# Patient Record
Sex: Male | Born: 1975
Health system: Southern US, Community
[De-identification: ages and names within clinical notes are randomized; demographics above are authoritative.]

## PROBLEM LIST (undated history)

## (undated) DIAGNOSIS — E049 Nontoxic goiter, unspecified: Secondary | ICD-10-CM

## (undated) DIAGNOSIS — E039 Hypothyroidism, unspecified: Secondary | ICD-10-CM

## (undated) DIAGNOSIS — E785 Hyperlipidemia, unspecified: Secondary | ICD-10-CM

## (undated) DIAGNOSIS — K219 Gastro-esophageal reflux disease without esophagitis: Secondary | ICD-10-CM

## (undated) HISTORY — DX: Gastro-esophageal reflux disease without esophagitis: K21.9

## (undated) HISTORY — DX: Nontoxic goiter, unspecified: E04.9

## (undated) HISTORY — DX: Hypothyroidism, unspecified: E03.9

## (undated) HISTORY — DX: Hyperlipidemia, unspecified: E78.5

---

## 2010-09-21 ENCOUNTER — Other Ambulatory Visit: Payer: Self-pay | Admitting: Surgery

## 2010-09-21 ENCOUNTER — Encounter (HOSPITAL_COMMUNITY): Payer: Managed Care, Other (non HMO) | Attending: Surgery

## 2010-09-21 DIAGNOSIS — Z01812 Encounter for preprocedural laboratory examination: Secondary | ICD-10-CM | POA: Insufficient documentation

## 2010-09-21 DIAGNOSIS — Z79899 Other long term (current) drug therapy: Secondary | ICD-10-CM | POA: Insufficient documentation

## 2010-09-21 LAB — BASIC METABOLIC PANEL
Chloride: 103 mEq/L (ref 96–112)
GFR calc Af Amer: >60 mL/min (ref >60)
GFR calc non Af Amer: >60 mL/min (ref >60)
Potassium: 4.3 mEq/L (ref 3.5–5.1)

## 2010-09-21 LAB — DIFFERENTIAL
Eosinophils Relative: 7 % — ABNORMAL HIGH (ref 0–5)
Lymphocytes Relative: 25 % (ref 12–46)
Lymphs Abs: 1.5 10*3/uL (ref 0.7–4.0)
Neutro Abs: 3.6 10*3/uL (ref 1.7–7.7)
Neutrophils Relative %: 60 % (ref 43–77)

## 2010-09-21 LAB — SURGICAL PCR SCREEN
MRSA, PCR: NEGATIVE
Staphylococcus aureus: POSITIVE — AB

## 2010-09-21 LAB — CBC
HCT: 45.4 % (ref 39.0–52.0)
Hemoglobin: 15.2 g/dL (ref 13.0–17.0)
MCV: 85.2 fL (ref 78.0–100.0)
RBC: 5.33 MIL/uL (ref 4.22–5.81)
WBC: 6 10*3/uL (ref 4.0–10.5)

## 2010-09-21 LAB — URINALYSIS, ROUTINE W REFLEX MICROSCOPIC
Bilirubin Urine: NEGATIVE
Glucose, UA: NEGATIVE mg/dL
Hgb urine dipstick: NEGATIVE
Specific Gravity, Urine: 1.017 (ref 1.005–1.030)
pH: 6.5 (ref 5.0–8.0)

## 2010-09-21 LAB — PROTIME-INR
INR: 0.88 (ref 0.00–1.49)
Prothrombin Time: 12.1 seconds (ref 11.6–15.2)

## 2010-10-03 ENCOUNTER — Observation Stay (HOSPITAL_COMMUNITY)
Admission: RE | Admit: 2010-10-03 | Discharge: 2010-10-04 | Disposition: A | Discharge status: Home / Self Care | Payer: Managed Care, Other (non HMO) | Source: Ambulatory Visit | Attending: Surgery | Admitting: Surgery

## 2010-10-03 ENCOUNTER — Other Ambulatory Visit: Payer: Self-pay | Admitting: Surgery

## 2010-10-03 DIAGNOSIS — E042 Nontoxic multinodular goiter: Principal | ICD-10-CM | POA: Insufficient documentation

## 2010-10-03 DIAGNOSIS — Z79899 Other long term (current) drug therapy: Secondary | ICD-10-CM | POA: Insufficient documentation

## 2010-10-03 DIAGNOSIS — E039 Hypothyroidism, unspecified: Secondary | ICD-10-CM | POA: Insufficient documentation

## 2010-10-03 DIAGNOSIS — Z01812 Encounter for preprocedural laboratory examination: Secondary | ICD-10-CM | POA: Insufficient documentation

## 2010-10-03 DIAGNOSIS — K219 Gastro-esophageal reflux disease without esophagitis: Secondary | ICD-10-CM | POA: Insufficient documentation

## 2010-10-03 DIAGNOSIS — E063 Autoimmune thyroiditis: Secondary | ICD-10-CM | POA: Insufficient documentation

## 2010-10-03 HISTORY — PX: TOTAL THYROIDECTOMY: SHX2547

## 2010-10-04 LAB — CALCIUM: Calcium: 9.3 mg/dL (ref 8.4–10.5)

## 2010-10-20 NOTE — Op Note (Signed)
NAMECZAR, YSAGUIRRE NO.:  [id number]  MEDICAL RECORD NO.:  [id number]           PATIENT TYPE:  O  LOCATION:  DAYL                         FACILITY:  [facility]  PHYSICIAN:  Velora Heckler, MD      DATE OF BIRTH:  Sep 22, 1975  DATE OF PROCEDURE:  10/03/2010                               OPERATIVE REPORT   PREOPERATIVE DIAGNOSES:  Multinodular thyroid goiter with moderate compressive symptoms, hypothyroidism, Hurthle cell changes.  POSTOPERATIVE DIAGNOSES:  Multinodular thyroid goiter with moderate compressive symptoms, hypothyroidism, Hurthle cell changes.  PROCEDURE:  Total thyroidectomy.  SURGEON:  Velora Heckler, MD, FACS  ANESTHESIA:  General.  ESTIMATED BLOOD LOSS:  150 cc.  PREPARATION:  ChloraPrep.  COMPLICATIONS:  None.  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  He was diagnosed by his primary physician in 2009 with a thyroid goiter.  He was started on thyroid medication. He has had a gradual increase in size of the gland documented by sequential ultrasound examination.  Fine-needle aspiration biopsy identified Hurthle cell changes felt to be related to thyroiditis.  The patient now comes to Surgery for thyroidectomy.  BODY OF REPORT:  Procedure was done in OR #2 at the [facility].  The patient was brought to the operating room, placed in supine position on the operating room table.  Following administration of general anesthesia, the patient was positioned and then prepped and draped in the usual strict aseptic fashion.  After ascertaining that an adequate level of anesthesia had been achieved, a Kocher incision was made with a #15 blade.  Dissection was carried through subcutaneous tissues and platysma.  Hemostasis was obtained via electrocautery.  Skin flaps were elevated cephalad and caudad from the thyroid notch to the sternal notch.  A Mahorner self-retaining  retractor was placed for exposure.  Strap muscles were incised in the midline. Dissection was begun on the left side.  Strap muscles were reflected laterally exposing the left thyroid lobe.  There is some adhesion of the strap muscles to the surface of the gland consistent with inflammatory changes.  Venous tributaries were divided between medium Ligaclips with the harmonic scalpel.  Gland was gently mobilized.  Inferior venous tributaries were divided between medium Ligaclips with the harmonic scalpel.  Superior pole was then dissected out and superior pole vessels were divided individually between medium Ligaclips with the harmonic scalpel.  Gland was mobilized laterally and posteriorly and rolled anteriorly.  Branches of the inferior thyroid artery were divided between small and small Ligaclips using the Harmonic scalpel. Parathyroid tissue was identified and preserved.  Recurrent laryngeal nerve was identified and preserved.  Gland was rolled up and on to the anterior trachea from which it was mobilized with the electrocautery. Isthmus was mobilized across the midline.  Venous tributaries were divided between Ligaclips with the Harmonic scalpel.  There was no significant pyramidal lobe identified.  There was a left node overlying the thyroid cartilage.  This was excised and submitted with the specimen for review.  Dry pack was placed in the left neck.  Next  we turned our attention to the right.  Strap muscles were again reflected laterally.  Right lobe was also somewhat adherent to the overlying strap muscles consistent with inflammatory change.  Venous tributaries were divided between medium Ligaclips with the harmonic scalpel.  Superior pole goes exceedingly high in the right neck and requires careful dissection.  Vascular structures were divided individually between medium and small Ligaclips with the harmonic scalpel.  Gland was rolled anteriorly.  Inferior venous tributaries  were divided between medium Ligaclips with the harmonic scalpel.  Branches of the inferior thyroid artery were divided between small and medium Ligaclips.  Gland was gradually mobilized anteriorly.  Parathyroid tissue was preserved.  Recurrent laryngeal nerve was identified and preserved.  Ligament of Allyson Sabal was released with the electrocautery and the gland was excised off the anterior trachea using the harmonic scalpel for hemostasis.  The entire thyroid gland was resected.  It was marked with a suture at the left superior pole.  It was submitted in its entirety to pathology.  Neck was irrigated with warm saline.  Hemostasis was obtained bilaterally with use of the electrocautery and small and medium Ligaclips.  Surgicel was placed in the operative field.  Strap muscles were reapproximated in the midline with interrupted 3-0 Vicryl sutures. Platysma was closed with interrupted 3-0 Vicryl sutures.  Skin was closed with running 4-0 Monocryl subcuticular suture.  Wound was washed and dried and Benzoin and Steri-Strips were applied.  Sterile dressings were applied.  The patient was awakened from Anesthesia and brought to the recovery room.  The patient tolerated the procedure well.   Velora Heckler, MD, FACS     TMG/MEDQ  D:  10/03/2010  T:  10/03/2010  Job:  [id number]  cc:   Dorisann Frames, M.D. Fax: [phone]  Velora Heckler, MD [phone] [place] [address]  Electronically Signed by Darnell Level MD on 10/20/2010 06:50:28 AM

## 2010-10-20 NOTE — Discharge Summary (Signed)
  Jay Lang, Jay Lang                  ACCOUNT NO.:  [id number]  MEDICAL RECORD NO.:  [id number]           PATIENT TYPE:  O  LOCATION:  1306                         FACILITY:  [facility]  PHYSICIAN:  Velora Heckler, MD      DATE OF BIRTH:  [birth year]  DATE OF ADMISSION:  10/03/2010 DATE OF DISCHARGE:  10/04/2010                              DISCHARGE SUMMARY   REASON FOR ADMISSION:  Thyroid goiter with compressive symptoms.  BRIEF HISTORY:  The patient is a 35 year old white male from [place], [place].  He was diagnosed in 2009 with thyroid goiter.  This has shown slow enlargement on sequential ultrasound examination.  He has been on thyroid hormone suppression, which has not caused any improvement.  He now comes to surgery for thyroidectomy for thyroid goiter with compressive symptoms and H?rthle cell change on fine- needle aspiration biopsy.  HOSPITAL COURSE:  The patient was admitted to the hospital on April 3 and taken directly to the operating room.  He underwent total thyroidectomy without complication.  Postoperative course was stable. Pain was well-controlled.  Serum calcium level on the evening of surgery was 9.0 and on the morning following surgery was 9.3.  The patient tolerated a regular diet and was prepared for discharge.  DISCHARGE PLAN:  The patient was discharged home on October 04, 2010, in good condition, tolerating a regular diet, and ambulating independently.  DISCHARGE MEDICATIONS:  Discharge medications include his normal dose of Synthroid 88 mcg daily and Percocet as needed for pain.  FOLLOWUP:  The patient will return to see me in the office in 2 to 3 weeks for a wound check.  We will check a calcium level prior to that office visit.  We will also make arrangements for followup with Dr. Dorisann Frames for adjustment of his thyroid hormone replacement.  FINAL DIAGNOSIS:  Thyroid goiter with compressive symptoms, final pathologic results pending at the  time of discharge.  CONDITION ON DISCHARGE:  Good.     Velora Heckler, MD     TMG/MEDQ  D:  10/04/2010  T:  10/04/2010  Job:  [id number]  cc:   Dorisann Frames, M.D. Fax: [phone]  Velora Heckler, MD [phone] [address] [place] [address]  Electronically Signed by Darnell Level MD on 10/20/2010 06:49:59 AM

## 2010-11-24 ENCOUNTER — Encounter (INDEPENDENT_AMBULATORY_CARE_PROVIDER_SITE_OTHER): Payer: Self-pay | Admitting: Surgery

## 2011-01-26 ENCOUNTER — Telehealth (INDEPENDENT_AMBULATORY_CARE_PROVIDER_SITE_OTHER): Payer: Self-pay | Admitting: Surgery

## 2011-01-26 NOTE — Telephone Encounter (Signed)
Left message will forward call to Dr. Gerrit Friends for update.

## 2011-03-16 ENCOUNTER — Encounter (INDEPENDENT_AMBULATORY_CARE_PROVIDER_SITE_OTHER): Payer: Self-pay | Admitting: Surgery

## 2011-03-19 ENCOUNTER — Ambulatory Visit (INDEPENDENT_AMBULATORY_CARE_PROVIDER_SITE_OTHER): Payer: Managed Care, Other (non HMO) | Admitting: Surgery

## 2011-03-19 ENCOUNTER — Encounter (INDEPENDENT_AMBULATORY_CARE_PROVIDER_SITE_OTHER): Payer: Self-pay | Admitting: Surgery

## 2011-03-19 VITALS — BP 146/98 | HR 72 | Temp 97.5°F | Ht 75.0 in | Wt 302.6 lb

## 2011-03-19 DIAGNOSIS — Z85850 Personal history of malignant neoplasm of thyroid: Secondary | ICD-10-CM | POA: Insufficient documentation

## 2011-03-19 DIAGNOSIS — C73 Malignant neoplasm of thyroid gland: Secondary | ICD-10-CM

## 2011-03-19 NOTE — Progress Notes (Signed)
Visit Diagnoses: 1. Thyroid cancer, multifocal papillary carcinoma, T1a, N0, Mx     HISTORY: Patient is a 35 year old male who underwent total thyroidectomy in April 2012 for multifocal papillary thyroid carcinoma. He is currently taking Synthroid 225 mcg daily under the direction of his endocrinologist. He and his wife have been trying to conceive their first child. He has delayed radioactive iodine treatment as a consequence. However they have decided to proceed with radioactive iodine treatment sometime this fall lower winter. He will discuss this with his endocrinologist and schedule accordingly.   PERTINENT REVIEW OF SYSTEMS: Patient has noted some mild solid food dysphagia, particularly with tough meats and breads. He has had minimum voice changes, and those have largely resolved.   EXAM: HEENT: normocephalic; pupils equal and reactive; sclerae clear; dentition good; mucous membranes moist NECK:  Surgical wound is well healed with good cosmetic result. Palpation in the thyroid bed shows no nodularity. There is no sign of recurrent disease.  symmetric on extension; no palpable anterior or posterior cervical lymphadenopathy; no supraclavicular masses; no tenderness CHEST: clear to auscultation bilaterally without rales, rhonchi, or wheezes CARDIAC: regular rate and rhythm without significant murmur; peripheral pulses are full EXT:  non-tender without edema; no deformity NEURO: no gross focal deficits; no sign of tremor   IMPRESSION: #1 multifocal papillary thyroid carcinoma, no evidence of recurrent disease #2 radioactive iodine ablation pending a decision between the patient and his endocrinologist   PLAN: The patient and I discussed all the above issues. He and his wife have decided to proceed with radioactive iodine treatment in the coming months. He will coordinate this with his endocrinologist.  Patient will return for a scheduled surgical followup in 6 months.   Velora Heckler, MD, FACS General & Endocrine Surgery [facility]

## 2011-04-19 ENCOUNTER — Telehealth (INDEPENDENT_AMBULATORY_CARE_PROVIDER_SITE_OTHER): Payer: Self-pay

## 2011-04-19 NOTE — Telephone Encounter (Signed)
Called to inform patient that his voice message was received today.  Advised patient to have notes faxed to [phone]. He stated he will have a procedure due to swallowing steak (or unable to) last night. Checked by notes are not in the [facility] system at this time. RMP

## 2011-05-22 ENCOUNTER — Other Ambulatory Visit (HOSPITAL_COMMUNITY): Payer: Self-pay | Admitting: Endocrinology

## 2011-05-22 DIAGNOSIS — C73 Malignant neoplasm of thyroid gland: Secondary | ICD-10-CM

## 2011-06-12 ENCOUNTER — Encounter (HOSPITAL_COMMUNITY)
Admission: RE | Admit: 2011-06-12 | Discharge: 2011-06-12 | Disposition: A | Discharge status: Home / Self Care | Payer: Managed Care, Other (non HMO) | Source: Ambulatory Visit | Attending: Endocrinology | Admitting: Endocrinology

## 2011-06-12 DIAGNOSIS — C73 Malignant neoplasm of thyroid gland: Secondary | ICD-10-CM | POA: Insufficient documentation

## 2011-06-12 MED ORDER — THYROTROPIN ALFA 1.1 MG IM SOLR
0.9000 mg | INTRAMUSCULAR | Status: DC
  Filled 2011-06-12: qty 0.9

## 2011-06-13 ENCOUNTER — Ambulatory Visit (HOSPITAL_COMMUNITY)
Admission: RE | Admit: 2011-06-13 | Discharge: 2011-06-13 | Disposition: A | Discharge status: Home / Self Care | Payer: Managed Care, Other (non HMO) | Source: Ambulatory Visit | Attending: Endocrinology | Admitting: Endocrinology

## 2011-06-13 MED ORDER — THYROTROPIN ALFA 1.1 MG IM SOLR
0.9000 mg | INTRAMUSCULAR | Status: DC
  Filled 2011-06-13: qty 0.9

## 2011-06-14 ENCOUNTER — Ambulatory Visit (HOSPITAL_COMMUNITY)
Admission: RE | Admit: 2011-06-14 | Discharge: 2011-06-14 | Disposition: A | Discharge status: Home / Self Care | Payer: Managed Care, Other (non HMO) | Source: Ambulatory Visit | Attending: Endocrinology | Admitting: Endocrinology

## 2011-06-14 DIAGNOSIS — C73 Malignant neoplasm of thyroid gland: Secondary | ICD-10-CM | POA: Insufficient documentation

## 2011-06-14 MED ORDER — SODIUM IODIDE I 131 CAPSULE
69.1000 | Freq: Once | INTRAVENOUS | Status: AC | PRN
  Administered 2011-06-14: 69.1 via ORAL

## 2011-06-21 ENCOUNTER — Encounter (HOSPITAL_COMMUNITY)
Admission: RE | Admit: 2011-06-21 | Discharge: 2011-06-21 | Disposition: A | Discharge status: Home / Self Care | Payer: Managed Care, Other (non HMO) | Source: Ambulatory Visit | Attending: Endocrinology | Admitting: Endocrinology

## 2011-06-21 DIAGNOSIS — C73 Malignant neoplasm of thyroid gland: Secondary | ICD-10-CM | POA: Insufficient documentation

## 2011-09-06 ENCOUNTER — Encounter (INDEPENDENT_AMBULATORY_CARE_PROVIDER_SITE_OTHER): Payer: Self-pay | Admitting: Surgery

## 2011-09-10 ENCOUNTER — Encounter (INDEPENDENT_AMBULATORY_CARE_PROVIDER_SITE_OTHER): Payer: Managed Care, Other (non HMO) | Admitting: Surgery

## 2011-10-08 ENCOUNTER — Encounter (INDEPENDENT_AMBULATORY_CARE_PROVIDER_SITE_OTHER): Payer: Managed Care, Other (non HMO) | Admitting: Surgery

## 2011-12-06 ENCOUNTER — Ambulatory Visit (INDEPENDENT_AMBULATORY_CARE_PROVIDER_SITE_OTHER): Payer: Managed Care, Other (non HMO) | Admitting: Surgery

## 2011-12-06 ENCOUNTER — Encounter (INDEPENDENT_AMBULATORY_CARE_PROVIDER_SITE_OTHER): Payer: Self-pay | Admitting: Surgery

## 2011-12-06 VITALS — BP 118/78 | HR 72 | Temp 97.8°F | Ht 75.0 in | Wt 308.0 lb

## 2011-12-06 DIAGNOSIS — C73 Malignant neoplasm of thyroid gland: Secondary | ICD-10-CM

## 2011-12-06 NOTE — Progress Notes (Signed)
Visit Diagnoses: 1. Thyroid cancer, multifocal papillary carcinoma, T1a, N0, Mx     HISTORY: The patient is a 36 year old white male who underwent total thyroidectomy in April 2012 for multifocal papillary thyroid carcinoma. The patient received radioactive iodine treatment in December 2012. He is now taking Synthroid 350 mcg daily. Patient returns for scheduled followup.  PERTINENT REVIEW OF SYSTEMS: Intermittent dysphagia related to reflux symptoms, denies tremor, denies palpitations, denies dyspnea, moderate weight gain due to 2 persistent hypothyroidism  EXAM: HEENT: normocephalic; pupils equal and reactive; sclerae clear; dentition good; mucous membranes moist NECK:  Well-healed cervical incision with good cosmetic result, no palpable masses in thyroid bed; symmetric on extension; no palpable anterior or posterior cervical lymphadenopathy; no supraclavicular masses; no tenderness CHEST: clear to auscultation bilaterally without rales, rhonchi, or wheezes CARDIAC: regular rate and rhythm without significant murmur; peripheral pulses are full EXT:  non-tender without edema; no deformity NEURO: no gross focal deficits; no sign of tremor   IMPRESSION: Personal history of multifocal papillary thyroid carcinoma, no evidence of recurrent disease  PLAN: Patient will continue close follow up with his endocrinologist. She continues to adjust his thyroid hormone replacement in hopes of suppressing his TSH level. Patient will have a followup total body iodine scan in December 2013. Patient will return to see me for followup in one year.  Velora Heckler, MD, FACS General & Endocrine Surgery [facility]

## 2012-05-09 MED FILL — Thyrotropin Alfa For Inj 1.1 MG: INTRAMUSCULAR | Qty: 0.9 | Status: AC

## 2012-06-03 ENCOUNTER — Other Ambulatory Visit: Payer: Self-pay | Admitting: Endocrinology

## 2012-06-03 DIAGNOSIS — C73 Malignant neoplasm of thyroid gland: Secondary | ICD-10-CM

## 2012-07-21 ENCOUNTER — Encounter (HOSPITAL_COMMUNITY)
Admission: RE | Admit: 2012-07-21 | Discharge: 2012-07-21 | Disposition: A | Discharge status: Home / Self Care | Payer: BC Managed Care – PPO | Source: Ambulatory Visit | Attending: Endocrinology | Admitting: Endocrinology

## 2012-07-21 DIAGNOSIS — C73 Malignant neoplasm of thyroid gland: Secondary | ICD-10-CM

## 2012-07-21 MED ORDER — THYROTROPIN ALFA 1.1 MG IM SOLR
0.9000 mg | INTRAMUSCULAR | Status: AC
  Administered 2012-07-21: 0.9 mg via INTRAMUSCULAR
  Filled 2012-07-21: qty 0.9

## 2012-07-22 ENCOUNTER — Encounter (HOSPITAL_COMMUNITY)
Admission: RE | Admit: 2012-07-22 | Discharge: 2012-07-22 | Disposition: A | Discharge status: Home / Self Care | Payer: BC Managed Care – PPO | Source: Ambulatory Visit | Attending: Endocrinology | Admitting: Endocrinology

## 2012-07-22 MED ORDER — THYROTROPIN ALFA 1.1 MG IM SOLR
0.9000 mg | INTRAMUSCULAR | Status: AC
  Administered 2012-07-22: 0.9 mg via INTRAMUSCULAR
  Filled 2012-07-22: qty 0.9

## 2012-07-23 ENCOUNTER — Encounter (HOSPITAL_COMMUNITY)
Admission: RE | Admit: 2012-07-23 | Discharge: 2012-07-23 | Disposition: A | Discharge status: Home / Self Care | Payer: BC Managed Care – PPO | Source: Ambulatory Visit | Attending: Endocrinology | Admitting: Endocrinology

## 2012-07-25 ENCOUNTER — Encounter (HOSPITAL_COMMUNITY)
Admission: RE | Admit: 2012-07-25 | Discharge: 2012-07-25 | Disposition: A | Discharge status: Home / Self Care | Payer: BC Managed Care – PPO | Source: Ambulatory Visit | Attending: Endocrinology | Admitting: Endocrinology

## 2012-07-25 MED ORDER — SODIUM IODIDE I 131 CAPSULE
4.0000 | Freq: Once | INTRAVENOUS | Status: AC | PRN
  Administered 2012-07-25: 4 via ORAL

## 2014-04-02 ENCOUNTER — Telehealth: Payer: Self-pay | Admitting: Family Medicine

## 2014-04-02 NOTE — Telephone Encounter (Signed)
Patient wants to schedule with Dr. Laurance Flatten he stated that he saw him as a little boy and that Pia Mau was his grandfather. Please advise

## 2014-04-05 NOTE — Telephone Encounter (Signed)
Checking to see if he is able to see [facility].  Call Lennette Bihari at [phone]

## 2014-04-08 NOTE — Telephone Encounter (Signed)
Pt aware - [facility] is not taking on New pt's at this time He was set up with Dr Sabra Heck for 05-25-14.

## 2014-05-25 ENCOUNTER — Ambulatory Visit: Payer: Self-pay | Admitting: Family Medicine

## 2014-06-17 ENCOUNTER — Ambulatory Visit (INDEPENDENT_AMBULATORY_CARE_PROVIDER_SITE_OTHER): Payer: BC Managed Care – PPO | Admitting: Family Medicine

## 2014-06-17 ENCOUNTER — Encounter: Payer: Self-pay | Admitting: Family Medicine

## 2014-06-17 ENCOUNTER — Ambulatory Visit (INDEPENDENT_AMBULATORY_CARE_PROVIDER_SITE_OTHER): Payer: BC Managed Care – PPO

## 2014-06-17 VITALS — BP 119/73 | HR 72 | Temp 98.1°F | Ht 75.0 in | Wt 329.0 lb

## 2014-06-17 DIAGNOSIS — K2100 Gastro-esophageal reflux disease with esophagitis, without bleeding: Secondary | ICD-10-CM

## 2014-06-17 DIAGNOSIS — Z0000 Encounter for general adult medical examination without abnormal findings: Secondary | ICD-10-CM

## 2014-06-17 DIAGNOSIS — C73 Malignant neoplasm of thyroid gland: Secondary | ICD-10-CM

## 2014-06-17 DIAGNOSIS — E890 Postprocedural hypothyroidism: Secondary | ICD-10-CM

## 2014-06-17 DIAGNOSIS — E785 Hyperlipidemia, unspecified: Secondary | ICD-10-CM

## 2014-06-17 NOTE — Patient Instructions (Addendum)
Continue current medications. Continue good therapeutic lifestyle changes which include good diet and exercise. Fall precautions discussed with patient. If an FOBT was given today- please return it to our front desk.   Flu Shots will be available at our office starting mid- September. Please call and schedule a FLU CLINIC APPOINTMENT.   We will arrange for you to see the clinical pharmacist to help you with your diet and weight loss regimen Continue to take her thyroid medicine, continue your medication for reflux Continue to drink plenty of fluids Avoid all soda drinks Walk and exercise as much as possible Return an FOBT We will call you with the results of your lab work and chest x-ray results as soon as those results become available Please let my nurse know the name of the medication or cream that you use on your feet and we will refill this. Use moisturizing lotions as much as possible

## 2014-06-17 NOTE — Progress Notes (Signed)
Subjective:    Patient ID: Jay Lang, male    DOB: [birth year], 38 y.o.   MRN: [id number]  HPI Patient here today to establish care with our practice and have some lab levels drawn. The patient's biggest problem is his history of thyroid cancer for which he is now currently taking thyroid replacement. He explained to return to get fasting lab work. We will also give him an FOBT to return and give him a flu shot today. We will get a chest x-ray as a baseline. When the  lab work is back we will probably set him up for a visit with the clinical pharmacist to work with him on nutrition and weight loss in combination with exercise. The patient's father died of a cerebral hemorrhage and the patient's mother is living and has had breast cancer but is doing well.        Patient Active Problem List   Diagnosis Date Noted  . Thyroid cancer, multifocal papillary carcinoma, T1a, N0, Mx 03/19/2011   Outpatient Encounter Prescriptions as of 06/17/2014  Medication Sig  . levothyroxine (SYNTHROID, LEVOTHROID) 125 MCG tablet Take 250 mcg by mouth daily.  Marland Kitchen NEXIUM 40 MG capsule   . [DISCONTINUED] levothyroxine (SYNTHROID, LEVOTHROID) 175 MCG tablet Take 350 mcg by mouth daily.    Review of Systems  Constitutional: Negative.        Weight issues   HENT: Negative.   Eyes: Negative.   Respiratory: Negative.   Cardiovascular: Negative.   Gastrointestinal: Negative.   Endocrine: Negative.   Genitourinary: Negative.   Musculoskeletal: Negative.   Skin: Negative.   Allergic/Immunologic: Negative.   Neurological: Negative.   Hematological: Negative.   Psychiatric/Behavioral: Negative.        Objective:   Physical Exam  Constitutional: He is oriented to person, place, and time. He appears well-developed and well-nourished. No distress.  HENT:  Head: Normocephalic and atraumatic.  Right Ear: External ear normal.  Left Ear: External ear normal.  Nose: Nose normal.  Mouth/Throat: Oropharynx is  clear and moist. No oropharyngeal exudate.  Eyes: Conjunctivae and EOM are normal. Pupils are equal, round, and reactive to light. Right eye exhibits no discharge. Left eye exhibits no discharge. No scleral icterus.  Neck: Normal range of motion. Neck supple. No thyromegaly present.  No masses adenopathy or thyromegaly  Cardiovascular: Normal rate, regular rhythm, normal heart sounds and intact distal pulses.  Exam reveals no gallop and no friction rub.   No murmur heard. At 72/m  Pulmonary/Chest: Effort normal and breath sounds normal. No respiratory distress. He has no wheezes. He has no rales. He exhibits no tenderness.  Abdominal: Soft. Bowel sounds are normal. He exhibits no mass. There is no tenderness. There is no rebound and no guarding.  Morbidly obese without masses or tenderness  Musculoskeletal: Normal range of motion. He exhibits no edema or tenderness.  Lymphadenopathy:    He has no cervical adenopathy.  Neurological: He is alert and oriented to person, place, and time. He has normal reflexes. No cranial nerve deficit.  Skin: Skin is warm and dry. No rash noted. No erythema. No pallor.  The skin on both feet is very dry on the plantar surface.  Psychiatric: He has a normal mood and affect. His behavior is normal. Judgment and thought content normal.  Nursing note and vitals reviewed.  BP 119/73 mmHg  Pulse 72  Temp(Src) 98.1 F (36.7 C) (Oral)  Ht 6\' 3"  (1.905 m)  Wt 329 lb (149.233 kg)  BMI 41.12 kg/m2  [facility] reading (PRIMARY) by  Dr.Moore-chest x-ray--  no active disease                                      Assessment & Plan:  1. Obesity, Class III, BMI 40-49.9 (morbid obesity)   2. Gastroesophageal reflux disease with esophagitis  3. Hyperlipidemia  4. Thyroid cancer  5. Postoperative hypothyroidism  6. Health care maintenance  Patient Instructions  Continue current medications. Continue good therapeutic lifestyle changes which include good diet and  exercise. Fall precautions discussed with patient. If an FOBT was given today- please return it to our front desk.   Flu Shots will be available at our office starting mid- September. Please call and schedule a FLU CLINIC APPOINTMENT.   We will arrange for you to see the clinical pharmacist to help you with your diet and weight loss regimen Continue to take her thyroid medicine, continue your medication for reflux Continue to drink plenty of fluids Avoid all soda drinks Walk and exercise as much as possible Return an FOBT We will call you with the results of your lab work and chest x-ray results as soon as those results become available Please let my nurse know the name of the medication or cream that you use on your feet and we will refill this. Use moisturizing lotions as much as possible    Arrie Senate MD

## 2014-06-30 ENCOUNTER — Ambulatory Visit (INDEPENDENT_AMBULATORY_CARE_PROVIDER_SITE_OTHER): Payer: BC Managed Care – PPO | Admitting: *Deleted

## 2014-06-30 ENCOUNTER — Other Ambulatory Visit (INDEPENDENT_AMBULATORY_CARE_PROVIDER_SITE_OTHER): Payer: BC Managed Care – PPO

## 2014-06-30 DIAGNOSIS — K2100 Gastro-esophageal reflux disease with esophagitis, without bleeding: Secondary | ICD-10-CM

## 2014-06-30 DIAGNOSIS — R635 Abnormal weight gain: Secondary | ICD-10-CM

## 2014-06-30 DIAGNOSIS — E785 Hyperlipidemia, unspecified: Secondary | ICD-10-CM

## 2014-06-30 DIAGNOSIS — E890 Postprocedural hypothyroidism: Secondary | ICD-10-CM

## 2014-06-30 DIAGNOSIS — Z23 Encounter for immunization: Secondary | ICD-10-CM

## 2014-06-30 DIAGNOSIS — Z1212 Encounter for screening for malignant neoplasm of rectum: Secondary | ICD-10-CM

## 2014-06-30 DIAGNOSIS — C73 Malignant neoplasm of thyroid gland: Secondary | ICD-10-CM

## 2014-06-30 DIAGNOSIS — Z0000 Encounter for general adult medical examination without abnormal findings: Secondary | ICD-10-CM

## 2014-06-30 LAB — POCT CBC
Granulocyte percent: 70.9 %G (ref 37–80)
HCT, POC: 48.3 % (ref 43.5–53.7)
Hemoglobin: 15.2 g/dL (ref 14.1–18.1)
LYMPH, POC: 1.7 (ref 0.6–3.4)
MCH, POC: 26.3 pg — AB (ref 27–31.2)
MCHC: 31.5 g/dL — AB (ref 31.8–35.4)
MCV: 83.6 fL (ref 80–97)
MPV: 7.7 fL (ref 0–99.8)
PLATELET COUNT, POC: 303 10*3/uL (ref 142–424)
POC GRANULOCYTE: 5.2 (ref 2–6.9)
POC LYMPH PERCENT: 23.8 %L (ref 10–50)
RBC: 5.8 M/uL (ref 4.69–6.13)
RDW, POC: 13 %
WBC: 7.3 10*3/uL (ref 4.6–10.2)

## 2014-06-30 NOTE — Progress Notes (Signed)
Lab only 

## 2014-06-30 NOTE — Addendum Note (Signed)
Addended by: Earlene Plater on: 06/30/2014 11:42 AM   Modules accepted: Orders

## 2014-07-01 ENCOUNTER — Telehealth: Payer: Self-pay | Admitting: *Deleted

## 2014-07-01 LAB — BMP8+EGFR
BUN/Creatinine Ratio: 15 (ref 8–19)
BUN: 12 mg/dL (ref 6–20)
CO2: 25 mmol/L (ref 18–29)
CREATININE: 0.79 mg/dL (ref 0.76–1.27)
Calcium: 9.3 mg/dL (ref 8.7–10.2)
Chloride: 99 mmol/L (ref 97–108)
GFR calc Af Amer: 134 mL/min/{1.73_m2} (ref >59)
GFR calc non Af Amer: 116 mL/min/{1.73_m2} (ref >59)
Glucose: 80 mg/dL (ref 65–99)
Potassium: 4.5 mmol/L (ref 3.5–5.2)
Sodium: 139 mmol/L (ref 134–144)

## 2014-07-01 LAB — NMR, LIPOPROFILE
Cholesterol: 229 mg/dL — ABNORMAL HIGH (ref 100–199)
HDL CHOLESTEROL BY NMR: 32 mg/dL — AB (ref >39)
HDL Particle Number: 26.2 umol/L — ABNORMAL LOW (ref >=30.5)
LDL Particle Number: 1988 nmol/L — ABNORMAL HIGH (ref <1000)
LDL SIZE: 19.9 nm (ref >20.5)
LDL-C: 153 mg/dL — ABNORMAL HIGH (ref 0–99)
LP-IR SCORE: 70 — AB (ref <=45)
Small LDL Particle Number: 1483 nmol/L — ABNORMAL HIGH (ref <=527)
Triglycerides by NMR: 222 mg/dL — ABNORMAL HIGH (ref 0–149)

## 2014-07-01 LAB — HEPATIC FUNCTION PANEL
ALBUMIN: 4.4 g/dL (ref 3.5–5.5)
ALT: 73 IU/L — ABNORMAL HIGH (ref 0–44)
AST: 39 IU/L (ref 0–40)
Alkaline Phosphatase: 88 IU/L (ref 39–117)
Bilirubin, Direct: 0.1 mg/dL (ref 0.00–0.40)
Total Bilirubin: 0.4 mg/dL (ref 0.0–1.2)
Total Protein: 7.1 g/dL (ref 6.0–8.5)

## 2014-07-01 NOTE — Telephone Encounter (Signed)
lmtcb regarding lab results. 

## 2014-07-01 NOTE — Telephone Encounter (Signed)
-----   Message from Chipper Herb, MD sent at 07/01/2014  9:43 AM EST ----- The blood sugar is good at 80. The creatinine, the most important kidney function test is within normal limits. The electrolytes including potassium are within normal limits. 1 liver function test is elevated and the patient should have his liver function tests rechecked in 4-6 weeks, he does not have to be fasting. With advanced lipid testing, the total LDL particle number is very elevated at 1988. The LDL C is also elevated at 153. The triglycerides are elevated at 222.------- please schedule this patient for an appointment with the clinical pharmacist to discuss diet modification and exercise and possible treatment for the elevated cholesterol++++++++ in the meantime encourage him to avoid fried greasy foods and to exercise more and work on losing some weight.

## 2014-07-02 LAB — FECAL OCCULT BLOOD, IMMUNOCHEMICAL: FECAL OCCULT BLD: NEGATIVE

## 2014-07-30 ENCOUNTER — Encounter (INDEPENDENT_AMBULATORY_CARE_PROVIDER_SITE_OTHER): Payer: Self-pay

## 2014-07-30 ENCOUNTER — Ambulatory Visit (INDEPENDENT_AMBULATORY_CARE_PROVIDER_SITE_OTHER): Payer: BLUE CROSS/BLUE SHIELD | Admitting: Pharmacist

## 2014-07-30 ENCOUNTER — Encounter: Payer: Self-pay | Admitting: Pharmacist

## 2014-07-30 DIAGNOSIS — E785 Hyperlipidemia, unspecified: Secondary | ICD-10-CM

## 2014-07-30 NOTE — Patient Instructions (Signed)
http://thomas.info/ - app used to track calories.   Mapmyrun.com - way to track calories burned.  Goal of at least 150 minutes of exercise per week

## 2014-07-30 NOTE — Progress Notes (Signed)
Subjective:     Jay Lang is a 39 y.o. male here for discussion regarding weight loss/obesity and dyslipidemia.  Patient is very motivated to make lifestyle changes to help with weight loss and to be healthier.   He has noted a weight gain of approximately 110 pounds over the last 25 years. He feels ideal weight is 265 pounds. Weight at graduation from high school was 218 pounds. History of eating disorders: none. There is a family history positive for obesity in the patient. Previous treatments for obesity include none. Obesity associated medical conditions: hyperlipidemia, thyroid disease and GERD. Obesity associated medications: none. Cardiovascular risk factors besides obesity: dyslipidemia, male gender, obesity (BMI >= 30 kg/m2) and sedentary lifestyle.  The following portions of the patient's history were reviewed and updated as appropriate: allergies, current medications, past family history, past medical history, past social history, past surgical history and problem list.    Objective:    Body mass index is 41.25 kg/(m^2).  Filed Vitals:   07/30/14 0822  BP: 120/72  Pulse: 88   Filed Weights   07/30/14 0822  Weight: 330 lb (149.687 kg)   Lipids Panel (06/30/2014) LDL-P = 1988 LDL-C = 153 HDL-C = 32 Tg = 229 Assessment:    Obesity. I assessed Leon to be in an action stage with respect to weight loss.   Dyslipidemia - patient has elevated LDL and Tg and low HDL.  His AHA ASCVD risk estimate is less than 5% (had to use age of 55 instead of 39yo since youngest estimate is 37yo)   Plan:    General weight loss/lifestyle modification strategies discussed (elicit support from others; identify saboteurs; non-food rewards, etc). Behavioral treatment: stress management and motivational tips discussed. Diet interventions: moderate (500 kCal/d) deficit diet and discussed specific changes such as:  Increase non-starchy vegetables - carrots, green bean, squash, zucchini, tomatoes,  onions, peppers, spinach and other green leafy vegetables, cabbage, lettuce, cucumbers, asparagus, okra (not fried), eggplant  Limit sugar and processed foods (cakes, cookies, ice cream, crackers and chips)  Increase fresh fruit but limit serving sizes 1/2 cup or about the size of tennis or baseball  Limit red meat to no more than 1-2 times per week (serving size about the size of your palm)  Choose whole grains / lean proteins - whole wheat bread, quinoa, whole grain rice (1/2 cup), fish, chicken, [place]  Regular aerobic exercise program discussed - specifically discussed tips to  Will not start statin now - but explained current and future risk of ASCVD to patient and how with TLC can lower risk.  If unable to get lipids down may need to consider statin in future.  Follow up in: 6 weeks and as needed.     Cherre Robins, PharmD, CPP

## 2014-09-17 ENCOUNTER — Encounter: Payer: Self-pay | Admitting: Pharmacist

## 2014-09-17 ENCOUNTER — Encounter (INDEPENDENT_AMBULATORY_CARE_PROVIDER_SITE_OTHER): Payer: Self-pay

## 2014-09-17 ENCOUNTER — Ambulatory Visit (INDEPENDENT_AMBULATORY_CARE_PROVIDER_SITE_OTHER): Payer: BLUE CROSS/BLUE SHIELD | Admitting: Pharmacist

## 2014-09-17 VITALS — Ht 75.0 in | Wt 332.0 lb

## 2014-09-17 DIAGNOSIS — E8881 Metabolic syndrome: Secondary | ICD-10-CM | POA: Insufficient documentation

## 2014-09-17 NOTE — Progress Notes (Signed)
Subjective:     Jay Lang is a 39 y.o. male here for follow up weight loss/obesity and dyslipidemia.  Patient seemed motivated at our last visit to make lifestyle changes to help with weight loss and to be healthier.  However, today he reports that he has not made many of the changes discussed at our last visit.  He does report that over the last 1-2 weeks he joined a basketball league once a week and has purchased exercise equipment and has been using over the last week. He went on vacation recently has he states that he just "blew it" and also that at work new snack machines have been added which have been very tempting.    He has notes a weight gain of approximately 110 pounds over the last 25 years. He feels ideal weight is 265 pounds. Weight at graduation from high school was 218 pounds. History of eating disorders: none. There is a family history positive for obesity in the patient. Previous treatments for obesity include none. Obesity associated medical conditions: hyperlipidemia, thyroid disease and GERD. Obesity associated medications: none. Cardiovascular risk factors besides obesity: dyslipidemia, male gender, obesity (BMI >= 30 kg/m2) and sedentary lifestyle.  The following portions of the patient's history were reviewed and updated as appropriate: allergies, current medications, past family history, past medical history, past social history, past surgical history and problem list.    Objective:    Body mass index is 41.5 kg/(m^2).  January 2016 was 41.25.  There were no vitals filed for this visit. Filed Weights   09/17/14 0841  Weight: 332 lb (150.594 kg)  Weight has increased by 2# since last visit in January 2016.   Lipids Panel (06/30/2014) LDL-P = 1988 LDL-C = 153 HDL-C = 32 Tg = 229 Assessment:    Obesity. I assessed Jay Lang to be in a relapse stage with respect to weight loss.   Metabolic Syndrome    Plan:    General weight loss/lifestyle modification strategies  Reviewed Patient was given hand out with tips for healthier eating.  Diet interventions: moderate (500 kCal/d) deficit diet and discussed specific changes such as:  Increase non-starchy vegetables - carrots, green bean, squash, zucchini, tomatoes, onions, peppers, spinach and other green leafy vegetables, cabbage, lettuce, cucumbers, asparagus, okra (not fried), eggplant  Limit sugar and processed foods (cakes, cookies, ice cream, crackers and chips)  Increase fresh fruit but limit serving sizes 1/2 cup or about the size of tennis or baseball  Limit red meat to no more than 1-2 times per week (serving size about the size of your palm)  Choose whole grains / lean proteins - whole wheat bread, quinoa, whole grain rice (1/2 cup), fish, chicken, [place]  Regular aerobic exercise program discussed - patient has recently made some improvements to more physical activity.  Commended him on these changes.  Encouraged him to stick with daily exercise at home - goal is 150 minutes at least per week.   Follow up in: 4 weeks and as needed.     Jay Lang, PharmD, CPP, CDE

## 2014-10-21 ENCOUNTER — Telehealth: Payer: Self-pay | Admitting: Pharmacist

## 2014-10-21 NOTE — Telephone Encounter (Signed)
There is avaialability on Monday, April 25th at 3:40pm or not until Friday May 13th - can put in slot for 3:30 or 4pm.  Tried to call patient but no answer - left message about above.  If he wants either of these appts he can call office and schedule.

## 2014-10-22 ENCOUNTER — Ambulatory Visit: Payer: Self-pay

## 2014-10-22 NOTE — Telephone Encounter (Signed)
Tried to call patient again about rescheduling appt.  Left message on VM to call me at office and I will do what I can to reschedule missed appt

## 2014-12-20 ENCOUNTER — Ambulatory Visit: Payer: BC Managed Care – PPO | Admitting: Family Medicine

## 2014-12-27 ENCOUNTER — Encounter: Payer: Self-pay | Admitting: Family Medicine

## 2015-01-06 ENCOUNTER — Encounter (INDEPENDENT_AMBULATORY_CARE_PROVIDER_SITE_OTHER): Payer: Self-pay

## 2015-01-06 ENCOUNTER — Encounter: Payer: Self-pay | Admitting: Pharmacist

## 2015-01-06 ENCOUNTER — Ambulatory Visit (INDEPENDENT_AMBULATORY_CARE_PROVIDER_SITE_OTHER): Payer: BLUE CROSS/BLUE SHIELD | Admitting: Pharmacist

## 2015-01-06 DIAGNOSIS — E782 Mixed hyperlipidemia: Secondary | ICD-10-CM | POA: Diagnosis not present

## 2015-01-06 DIAGNOSIS — E8881 Metabolic syndrome: Secondary | ICD-10-CM | POA: Diagnosis not present

## 2015-01-06 NOTE — Progress Notes (Signed)
Subjective:     Jay Lang is a 39 y.o. male here for follow up weight loss/obesity and dyslipidemia.  Patient has been trying to make lifestyle changes to help with weight loss and to be healthier.  However, today he reports that he continues to struggle with increasing physical activity.  The basketball league he joined has stopped for the next 2-3 months.  Patient works in [place] and is gone from home 7am to 7pm.  He has a 78 yo daughter and is married.  He reports going on vacation recently and that he did not adhere to healthy eating recommendations.  Diet - no breakfast, am snack of crackers (low fat) from vending machine, Lunch is [place] burger or tomaot sandwich from home.  Supper - has improved because wife is at home during summer to [place] more and getting vegetables from garden.  He continues to drink 1 Mt Dew (20 oz) daily and sweet tea.  He also drinks water.    He has notes a weight gain of approximately 110 pounds over the last 25 years. He feels ideal weight is 265 pounds. Weight at graduation from high school was 218 pounds. History of eating disorders: none. There is a family history positive for obesity in the patient. Previous treatments for obesity include none. Obesity associated medical conditions: hyperlipidemia, thyroid disease and GERD. Obesity associated medications: none. Cardiovascular risk factors besides obesity: dyslipidemia, male gender, obesity (BMI >= 30 kg/m2) and sedentary lifestyle.  The following portions of the patient's history were reviewed and updated as appropriate: allergies, current medications, past family history, past medical history, past social history, past surgical history and problem list.    Objective:    Body mass index is 41.12 kg/(m^2).   January 2016 was 41.25.  Filed Vitals:   01/06/15 0901  BP: 126/82  Pulse: 88   Filed Weights   01/06/15 0901  Weight: 329 lb (149.233 kg)  Weight has decreased  by 3# since last visit March  2016  Lipids Panel (06/30/2014) LDL-P = 1988 LDL-C = 153 HDL-C = 32 Tg = 229 Assessment:    Obesity. I assessed Jay Lang to be in a relapse stage with respect to weight loss.   Metabolic Syndrome Mixed hyperlipidemia    Plan:    General weight loss/lifestyle modification strategies Reviewed Diet interventions: moderate (500 kCal/d) deficit diet and discussed specific changes such as:  Increase non-starchy vegetables - carrots, green bean, squash, zucchini, tomatoes, onions, peppers, spinach and other green leafy vegetables, cabbage, lettuce, cucumbers, asparagus, okra (not fried), eggplant  Limit sugar and processed foods (cakes, cookies, ice cream, crackers and chips).  Discontinue calorie filled / sugary drinks - try half sweet and half unsweet tea graduallly decreasing amount of sweet tea until he gets use to unsweetened tea.    Increase fresh fruit but limit serving sizes 1/2 cup or about the size of tennis or baseball  Limit red meat to no more than 1-2 times per week (serving size about the size of your palm)  Choose whole grains / lean proteins - whole wheat bread, quinoa, whole grain rice (1/2 cup), fish, chicken, [place]  Discussed health breakfast ideas - boiled egg, oatmeal, high fiber cereal.  Regular aerobic exercise program discussed  Encouraged him to stick with daily exercise at home - goal is 150 minutes at least per week.  Ok to try [employer] - an essential supplement his wife uses which might decrease appetite.   Also discussed Rx medications for  weight loss - patient given information on Contrave but does not want to start rx currently. Discussed health consequences of obesity - GERD, arthritis, diabetes risk.   Orders Placed This Encounter  Procedures  . CMP14+EGFR  . Lipid panel  . LDL cholesterol, direct     Follow up in: 2 weeks with Dr Laurance Flatten and 6 weeks with clinical pharmacist.    Cherre Robins, PharmD, CPP, CDE

## 2015-01-06 NOTE — Patient Instructions (Signed)
Ok to try [place] essential oils  Recommend stop or limit calorie containing beverages - mix sweet and unsweet tea, no Egbert Garibaldi / [place] Dew  Try to get better breakfast - boiled egg, egg white omelet, lean ham + egg on whole wheat toast or sandwich rounds, oatmeal, whole grain cereal - cereal with fiber.   Increase non-starchy vegetables - carrots, green bean, squash, zucchini, tomatoes, onions, peppers, spinach and other green leafy vegetables, cabbage, lettuce, cucumbers, asparagus, okra (not fried), eggplant limit sugar and processed foods (cakes, cookies, ice cream, crackers and chips) Increase fresh fruit but limit serving sizes 1/2 cup or about the size of tennis or baseball limit red meat to no more than 1-2 times per week (serving size about the size of your palm) Choose lean proteins - whole wheat bread, quinoa, whole grain rice (1/2 cup), fish, chicken, [place]

## 2015-01-07 LAB — LIPID PANEL
CHOLESTEROL TOTAL: 210 mg/dL — AB (ref 100–199)
Chol/HDL Ratio: 6.2 ratio units — ABNORMAL HIGH (ref 0.0–5.0)
HDL: 34 mg/dL — ABNORMAL LOW (ref >39)
LDL Calculated: 140 mg/dL — ABNORMAL HIGH (ref 0–99)
Triglycerides: 182 mg/dL — ABNORMAL HIGH (ref 0–149)
VLDL Cholesterol Cal: 36 mg/dL (ref 5–40)

## 2015-01-07 LAB — CMP14+EGFR
A/G RATIO: 1.8 (ref 1.1–2.5)
ALT: 73 IU/L — ABNORMAL HIGH (ref 0–44)
AST: 47 IU/L — AB (ref 0–40)
Albumin: 4.4 g/dL (ref 3.5–5.5)
Alkaline Phosphatase: 91 IU/L (ref 39–117)
BUN/Creatinine Ratio: 16 (ref 8–19)
BUN: 13 mg/dL (ref 6–20)
Bilirubin Total: 0.3 mg/dL (ref 0.0–1.2)
CO2: 22 mmol/L (ref 18–29)
CREATININE: 0.83 mg/dL (ref 0.76–1.27)
Calcium: 9.7 mg/dL (ref 8.7–10.2)
Chloride: 99 mmol/L (ref 97–108)
GFR calc Af Amer: 130 mL/min/{1.73_m2} (ref >59)
GFR, EST NON AFRICAN AMERICAN: 112 mL/min/{1.73_m2} (ref >59)
GLOBULIN, TOTAL: 2.5 g/dL (ref 1.5–4.5)
Glucose: 92 mg/dL (ref 65–99)
Potassium: 4.7 mmol/L (ref 3.5–5.2)
Sodium: 139 mmol/L (ref 134–144)
TOTAL PROTEIN: 6.9 g/dL (ref 6.0–8.5)

## 2015-01-07 LAB — LDL CHOLESTEROL, DIRECT: LDL Direct: 157 mg/dL — ABNORMAL HIGH (ref 0–99)

## 2015-01-10 ENCOUNTER — Telehealth: Payer: Self-pay | Admitting: Family Medicine

## 2015-01-11 ENCOUNTER — Ambulatory Visit: Payer: BLUE CROSS/BLUE SHIELD | Admitting: Family Medicine

## 2015-01-12 ENCOUNTER — Telehealth: Payer: Self-pay | Admitting: Pharmacist

## 2015-01-12 NOTE — Telephone Encounter (Signed)
FBG WNL. ALT and AST slightly elevated (possibly fatty liver NASH). LDL, TG and TC are all elevated, though results have improved since last checked 06/2014.  Current AHA 10 year estimated risk (has to use age of 39yo instead of patients true age) was 2.1% (not in statin advantage group - yet) Encourage to increase physical activity, decrease portion sizes and limit high sugar and CHO containing foods. Stress weight loss. Patient notified - follow up planned in 6 to 8 weeks.

## 2015-01-19 NOTE — Telephone Encounter (Signed)
Detailed message left that if patient still has question to please give [place] a call back but if not he can disregard this message. This encounter will be closed.

## 2015-01-26 ENCOUNTER — Encounter: Payer: Self-pay | Admitting: Family Medicine

## 2015-01-26 ENCOUNTER — Ambulatory Visit (INDEPENDENT_AMBULATORY_CARE_PROVIDER_SITE_OTHER): Payer: BLUE CROSS/BLUE SHIELD | Admitting: Family Medicine

## 2015-01-26 VITALS — BP 138/70 | HR 79 | Temp 97.2°F | Ht 75.0 in | Wt 330.0 lb

## 2015-01-26 DIAGNOSIS — E785 Hyperlipidemia, unspecified: Secondary | ICD-10-CM | POA: Diagnosis not present

## 2015-01-26 DIAGNOSIS — E8881 Metabolic syndrome: Secondary | ICD-10-CM | POA: Diagnosis not present

## 2015-01-26 DIAGNOSIS — E782 Mixed hyperlipidemia: Secondary | ICD-10-CM

## 2015-01-26 DIAGNOSIS — K219 Gastro-esophageal reflux disease without esophagitis: Secondary | ICD-10-CM

## 2015-01-26 DIAGNOSIS — C73 Malignant neoplasm of thyroid gland: Secondary | ICD-10-CM

## 2015-01-26 DIAGNOSIS — Z0000 Encounter for general adult medical examination without abnormal findings: Secondary | ICD-10-CM | POA: Diagnosis not present

## 2015-01-26 DIAGNOSIS — K640 First degree hemorrhoids: Secondary | ICD-10-CM

## 2015-01-26 NOTE — Patient Instructions (Addendum)
Continue current medications. Continue good therapeutic lifestyle changes which include good diet and exercise. Fall precautions discussed with patient. If an FOBT was given today- please return it to our front desk.    After your visit with [place] today you will receive a survey in the mail or online from [employer] regarding your care with [place]. Please take a moment to fill this out. Your feedback is very important to [place] as you can help [place] better understand your patient needs as well as improve your experience and satisfaction. WE CARE ABOUT YOU!!!   Continue to work with the clinical pharmacist on reduce caloric intake and more exercise to help lose weight Consider the Weight Watchers program we have here at the office also Drink more water and decrease the sugar in the diet completely Use Stevia if needed for sweetening purposes If you develop any rectal pain regarding the small lump in the rectum please return to clinic and we will evaluate that hemorrhoid further and possibly do an I and D. Keep bowels moving regularly by drinking more fluids Watch sodium intake as this raises the blood pressure  schedule an eye exam

## 2015-01-26 NOTE — Progress Notes (Signed)
Subjective:    Patient ID: Jay Lang, male    DOB: [birth year], 39 y.o.   MRN: [id number]  HPI Patient is here today for annual wellness exam and follow up of chronic medical problems which includes hyperlipidemia. He is taking medications regularly. The patient continues to have an elevated BMI greater than 41. He has a history of thyroid cancer. He also has hyperlipidemia. Today his initial blood pressure readings are elevated. He denies chest pain shortness of breath trouble swallowing heartburn indigestion nausea vomiting or diarrhea or blood in the stool. He does have rectal pain. He's having no problems passing his water. He is on thyroid replacement and is taking Nexium. The patient had recent blood work on July 7 of this year. On a traditional panel has triglycerides were elevated at 182. The LDL C was elevated at 140. These readings were improved from previous readings. The blood sugar and renal tests were normal and all liver function tests were normal except the AST was elevated slightly at 47 ALTs was elevated at 73      Patient Active Problem List   Diagnosis Date Noted  . Metabolic syndrome 17/40/8144  . Severe obesity (BMI >= 40) 09/17/2014  . Dyslipidemia 07/30/2014  . Thyroid cancer, multifocal papillary carcinoma, T1a, N0, Mx 03/19/2011   Outpatient Encounter Prescriptions as of 01/26/2015  Medication Sig  . levothyroxine (SYNTHROID, LEVOTHROID) 125 MCG tablet Take 250 mcg by mouth daily.  Marland Kitchen NEXIUM 40 MG capsule Take 40 mg by mouth daily.    No facility-administered encounter medications on file as of 01/26/2015.      Review of Systems  Constitutional: Negative.   HENT: Negative.   Eyes: Negative.   Respiratory: Negative.   Cardiovascular: Negative.   Gastrointestinal: Positive for rectal pain (recently noticed).  Endocrine: Negative.   Genitourinary: Negative.   Musculoskeletal: Negative.   Skin: Negative.   Allergic/Immunologic: Negative.   Neurological:  Negative.   Hematological: Negative.   Psychiatric/Behavioral: Negative.        Objective:   Physical Exam  Constitutional: He is oriented to person, place, and time. He appears well-developed and well-nourished. No distress.  Pleasant and alert morbidly obese white male  HENT:  Head: Normocephalic and atraumatic.  Right Ear: External ear normal.  Left Ear: External ear normal.  Nose: Nose normal.  Mouth/Throat: Oropharynx is clear and moist. No oropharyngeal exudate.  Eyes: Conjunctivae and EOM are normal. Pupils are equal, round, and reactive to light. Right eye exhibits no discharge. Left eye exhibits no discharge. No scleral icterus.  Neck: Normal range of motion. Neck supple. No thyromegaly present.  Without bruits or thyromegaly  Cardiovascular: Normal rate, regular rhythm, normal heart sounds and intact distal pulses.   No murmur heard. Heart has a regular rate and rhythm at 72/m  Pulmonary/Chest: Effort normal and breath sounds normal. No respiratory distress. He has no wheezes. He has no rales. He exhibits no tenderness.  No axillary adenopathy Lungs are clear anteriorly and posteriorly  Abdominal: Soft. Bowel sounds are normal. He exhibits no mass. There is no tenderness. There is no rebound and no guarding.  Morbidly obese abdomen without organ enlargement masses or tenderness  Genitourinary: Rectum normal and penis normal. No penile tenderness.  The prostate was difficult to palpate but the rectal exam appeared normal except for small perianal hemorrhoid at about 9:00 on the left side. No inguinal hernias palpable and External genitalia appear within normal limits.  Musculoskeletal: Normal range of motion. He exhibits  no edema or tenderness.  Lymphadenopathy:    He has no cervical adenopathy.  Neurological: He is alert and oriented to person, place, and time. He has normal reflexes. No cranial nerve deficit.  Skin: Skin is warm and dry. No rash noted. No erythema. No  pallor.  Psychiatric: He has a normal mood and affect. His behavior is normal. Judgment and thought content normal.  Nursing note and vitals reviewed.  BP 149/92 mmHg  Pulse 79  Temp(Src) 97.2 F (36.2 C) (Oral)  Ht 6\' 3"  (1.905 m)  Wt 330 lb (149.687 kg)  BMI 41.25 kg/m2  EKG: Within normal limits        Assessment & Plan:  1. Mixed hyperlipidemia -Continue aggressive therapeutic lifestyle changes and follow-up with clinical pharmacy - EKG 12-Lead  2. Metabolic syndrome -Continue aggressive therapeutic lifestyle changes and follow-up with pharmacy  3. Severe obesity (BMI >= 40)  4. Dyslipidemia -Continue diet with follow-up of lipids in 4 months  5. Thyroid cancer -Continue current thyroid treatment  6. Annual physical exam - EKG 12-Lead  7. Gastroesophageal reflux disease, esophagitis presence not specified -Continue Nexium  8. First degree hemorrhoids -We will continue to monitor the hemorrhoid situation if more pain occurs then I&D may be necessary  No orders of the defined types were placed in this encounter.   Patient Instructions  Continue current medications. Continue good therapeutic lifestyle changes which include good diet and exercise. Fall precautions discussed with patient. If an FOBT was given today- please return it to our front desk.    After your visit with [place] today you will receive a survey in the mail or online from [employer] regarding your care with [place]. Please take a moment to fill this out. Your feedback is very important to [place] as you can help [place] better understand your patient needs as well as improve your experience and satisfaction. WE CARE ABOUT YOU!!!   Continue to work with the clinical pharmacist on reduce caloric intake and more exercise to help lose weight Consider the Weight Watchers program we have here at the office also Drink more water and decrease the sugar in the diet completely Use Stevia if needed for sweetening  purposes If you develop any rectal pain regarding the small lump in the rectum please return to clinic and we will evaluate that hemorrhoid further and possibly do an I and D. Keep bowels moving regularly by drinking more fluids Watch sodium intake as this raises the blood pressure   Arrie Senate MD

## 2015-02-24 ENCOUNTER — Ambulatory Visit (INDEPENDENT_AMBULATORY_CARE_PROVIDER_SITE_OTHER): Payer: BLUE CROSS/BLUE SHIELD | Admitting: Pharmacist

## 2015-02-24 ENCOUNTER — Encounter: Payer: Self-pay | Admitting: Pharmacist

## 2015-02-24 ENCOUNTER — Encounter (INDEPENDENT_AMBULATORY_CARE_PROVIDER_SITE_OTHER): Payer: Self-pay

## 2015-02-24 VITALS — BP 128/78 | HR 77 | Ht 75.0 in | Wt 331.0 lb

## 2015-02-24 DIAGNOSIS — E8881 Metabolic syndrome: Secondary | ICD-10-CM | POA: Diagnosis not present

## 2015-02-24 DIAGNOSIS — E785 Hyperlipidemia, unspecified: Secondary | ICD-10-CM

## 2015-02-24 NOTE — Patient Instructions (Addendum)
Switch to light salad dressings and have on the side.  Vinegarettes and lite [place] dressing.  Dressings made with yogurt.   Increase exercise / physical activity  Goal of exercise 3 times for next month.  Consider [employer] or [employer].

## 2015-02-24 NOTE — Progress Notes (Signed)
Subjective:     Jay Lang is a 39 y.o. male here for follow up weight loss/obesity and dyslipidemia.  Patient has been trying to make lifestyle changes to help with weight loss and to be healthier.  However, today he reports that he continues to struggle with increasing physical activity and making health food choices.   Patient works in [place] and is gone from home 7am to 7pm.  He has a 10 yo daughter and is married.  Diet - am snack of crackers (low fat) from vending machine, Lunch is mostly eating out.  Supper - eating late sometimes after 8pm.  Continues to drink 1 Mt Dew (20 oz) less only once a week instead of daily and now drinking 50/50 mix of sweet and unsweet tea instead of sweet tea.  He also drinks water.  Exercise - walking some on weekend - golfs - combo of walking and riding.   He has notes a weight gain of approximately 110 pounds over the last 25 years. He feels ideal weight is 265 pounds. Weight at graduation from high school was 218 pounds. History of eating disorders: none. There is a family history positive for obesity in the patient. Previous treatments for obesity include none. Obesity associated medical conditions: hyperlipidemia, thyroid disease and GERD. Obesity associated medications: none. Cardiovascular risk factors besides obesity: dyslipidemia, male gender, obesity (BMI >= 30 kg/m2) and sedentary lifestyle.  The following portions of the patient's history were reviewed and updated as appropriate: allergies, current medications, past family history, past medical history, past social history, past surgical history and problem list.    Objective:    Body mass index is 41.37 kg/(m^2).   January 2016 was 41.25.  Filed Vitals:   02/24/15 0838  BP: 128/78  Pulse: 77   Filed Weights   02/24/15 0838  Weight: 331 lb (150.141 kg)   Weight has increased by 1# since last visit  Lipids Panel (06/30/2014)         (01/06/2015)         LDL-P = 1988 LDL-C =  153    140 HDL-C = 32      34 Tg = 229    182 Assessment:    Obesity. I assessed Jay Lang to be in a relapse stage with respect to weight loss.   Metabolic Syndrome Mixed hyperlipidemia    Plan:    General weight loss/lifestyle modification strategies Reviewed Diet interventions: moderate (500 kCal/d) deficit diet and discussed specific changes such as:  Increase non-starchy vegetables - carrots, green bean, squash, zucchini, tomatoes, onions, peppers, spinach and other green leafy vegetables, cabbage, lettuce, cucumbers, asparagus, okra (not fried), eggplant  Limit sugar and processed foods (cakes, cookies, ice cream, crackers and chips).  Discontinue calorie filled / sugary drinks - try half sweet and half unsweet tea graduallly decreasing amount of sweet tea until he gets use to unsweetened tea.    Increase fresh fruit but limit serving sizes 1/2 cup or about the size of tennis or baseball  Limit red meat to no more than 1-2 times per week (serving size about the size of your palm)  Choose whole grains / lean proteins - whole wheat bread, quinoa, whole grain rice (1/2 cup), fish, chicken, [place]  Discussed health breakfast ideas - boiled egg, oatmeal, high fiber cereal.  Regular aerobic exercise program discussed  Encouraged him to stick with daily exercise at home - goal is 150 minutes at least per week. We discussed joining [employer] or gym that  is on the way to work.  Also suggested try to walk during lunch when weather if not too hot.   Patient continues to decline pharmacotherapy for weight loss.  Discussed health consequences of obesity - GERD, arthritis, diabetes risk.    Follow up in: 6 weeks with clinical pharmacist.    Cherre Robins, PharmD, CPP, CDE

## 2015-04-07 ENCOUNTER — Encounter: Payer: Self-pay | Admitting: Pharmacist

## 2015-04-29 ENCOUNTER — Encounter: Payer: Self-pay | Admitting: Pharmacist

## 2015-05-05 ENCOUNTER — Ambulatory Visit: Payer: BLUE CROSS/BLUE SHIELD | Admitting: Pharmacist

## 2015-05-09 ENCOUNTER — Ambulatory Visit (INDEPENDENT_AMBULATORY_CARE_PROVIDER_SITE_OTHER): Payer: BLUE CROSS/BLUE SHIELD | Admitting: Pharmacist

## 2015-05-09 ENCOUNTER — Encounter: Payer: Self-pay | Admitting: Pharmacist

## 2015-05-09 DIAGNOSIS — E785 Hyperlipidemia, unspecified: Secondary | ICD-10-CM | POA: Diagnosis not present

## 2015-05-09 DIAGNOSIS — E8881 Metabolic syndrome: Secondary | ICD-10-CM | POA: Diagnosis not present

## 2015-05-09 NOTE — Patient Instructions (Signed)
Look into [place] / 5K  Get new scales - try one with blue tooth from [employer].  Try [place] beverage flavoring.

## 2015-05-09 NOTE — Progress Notes (Signed)
Subjective:     Jay Lang is a 39 y.o. male here for follow up weight loss/obesity and dyslipidemia.  Patient has been trying to make lifestyle changes to help with weight loss and to be healthier.  However, today he reports that he continues to struggle with increasing physical activity and making health food choices.   Patient works in [place] and is gone from home 7am to 7pm.  He has a 42 yo daughter and is married. He reports that since our last visit he did receive diet information that I sent him.  However due to an audit at work he has not been able to focus on diet, health, and exercise.    He has notes a weight gain of approximately 110 pounds over the last 25 years. He feels ideal weight is 265 pounds. Weight at graduation from high school was 218 pounds. History of eating disorders: none. There is a family history positive for obesity in the patient. Previous treatments for obesity include none. Obesity associated medical conditions: hyperlipidemia, thyroid disease and GERD. Obesity associated medications: none. Cardiovascular risk factors besides obesity: dyslipidemia, male gender, obesity (BMI >= 30 kg/m2) and sedentary lifestyle.  The following portions of the patient's history were reviewed and updated as appropriate: allergies, current medications, past family history, past medical history, past social history, past surgical history and problem list.    Objective:    Body mass index is 41.5 kg/(m^2).   January 2016 was 41.25.  Filed Vitals:   05/09/15 1602  BP: 140/86  Pulse: 78   Filed Weights   05/09/15 1602  Weight: 332 lb (150.594 kg)   Weight has increased by 1# since last visit  Lipids Panel (06/30/2014)         (01/06/2015)         LDL-P = 1988 LDL-C = 153    140 HDL-C = 32      34 Tg = 229    182 Assessment:    Obesity. I assessed Jay Lang to be in a relapse stage with respect to weight loss.   Metabolic Syndrome Mixed hyperlipidemia    Plan:    General  weight loss/lifestyle modification strategies reviewed Diet interventions: moderate (500 kCal/d) deficit diet  Patient advised to buy new scales as his current scales do not weigh over 300#  Regular aerobic exercise program discussed  - encouraged him to sign up for a virtual 5K in which he could walk at his convience.  He should do this with a friend or family member for support Patient continues to decline pharmacotherapy for weight loss.  Discussed health consequences of obesity - GERD, arthritis, diabetes risk.    Follow up in: 4 weeks with PCP   Cherre Robins, PharmD, CPP, CDE

## 2015-05-19 ENCOUNTER — Telehealth: Payer: Self-pay | Admitting: Pharmacist

## 2015-05-19 NOTE — Telephone Encounter (Signed)
-----   Message from [place], [place] sent at 05/09/2015  3:44 PM EST ----- Regarding: scales Call to ask about scales and diet

## 2015-05-19 NOTE — Telephone Encounter (Signed)
No answer left message.

## 2015-06-08 ENCOUNTER — Encounter: Payer: Self-pay | Admitting: Family Medicine

## 2015-06-08 ENCOUNTER — Ambulatory Visit (INDEPENDENT_AMBULATORY_CARE_PROVIDER_SITE_OTHER): Payer: BLUE CROSS/BLUE SHIELD | Admitting: Family Medicine

## 2015-06-08 DIAGNOSIS — E8881 Metabolic syndrome: Secondary | ICD-10-CM | POA: Diagnosis not present

## 2015-06-08 DIAGNOSIS — E785 Hyperlipidemia, unspecified: Secondary | ICD-10-CM

## 2015-06-08 DIAGNOSIS — K219 Gastro-esophageal reflux disease without esophagitis: Secondary | ICD-10-CM

## 2015-06-08 DIAGNOSIS — Z23 Encounter for immunization: Secondary | ICD-10-CM

## 2015-06-08 DIAGNOSIS — Z0000 Encounter for general adult medical examination without abnormal findings: Secondary | ICD-10-CM

## 2015-06-08 MED ORDER — TETANUS-DIPHTH-ACELL PERTUSSIS 5-2.5-18.5 LF-MCG/0.5 IM SUSP: 0.5000 mL | Freq: Once | INTRAMUSCULAR | Status: DC

## 2015-06-08 NOTE — Progress Notes (Signed)
Subjective:    Patient ID: Jay Lang, male    DOB: [birth year], 39 y.o.   MRN: [id number]  HPI    Pt here for follow up and management of chronic medical problems.  His medical problems include dyslipidemia and metabolic syndrome.  He is taking medications regularly. The patient has lost down to 324 pounds since the last visit he was 332. His BMI is still 41.6. His blood pressure today slightly elevated. He is due to get lab work today and a flu shot today and a TD Today. He will also be given an FOBT to return. He is been followed closely by the clinical pharmacists who is helping him work on his diet exercise and weight loss. Since the last visit and November he has lost the 8 pounds mentioned above. That he has lost a lot more than this overall and we are very excited about this. He denies any chest pain shortness of breath trouble swallowing heartburn indigestion and nausea vomiting diarrhea or blood in stool. He continues to take his Nexium. He continues to take his thyroid medicine. He's passing his water without problems.    Patient Active Problem List   Diagnosis Date Noted  . Metabolic syndrome 81/85/6314  . Severe obesity (BMI >= 40) ([place]) 09/17/2014  . Dyslipidemia 07/30/2014  . Thyroid cancer, multifocal papillary carcinoma, T1a, N0, Mx 03/19/2011   Outpatient Encounter Prescriptions as of 06/08/2015  Medication Sig  . levothyroxine (SYNTHROID, LEVOTHROID) 125 MCG tablet Take 250 mcg by mouth daily.  Marland Kitchen NEXIUM 40 MG capsule Take 40 mg by mouth daily.   . Tdap (BOOSTRIX) 5-2.5-18.5 LF-MCG/0.5 injection Inject 0.5 mLs into the muscle once.   No facility-administered encounter medications on file as of 06/08/2015.      Review of Systems  Constitutional: Negative.   HENT: Negative.   Eyes: Negative.   Respiratory: Negative.   Cardiovascular: Negative.   Gastrointestinal: Negative.   Endocrine: Negative.   Genitourinary: Negative.   Musculoskeletal: Negative.   Skin:  Negative.   Allergic/Immunologic: Negative.   Neurological: Negative.   Hematological: Negative.   Psychiatric/Behavioral: Negative.        Objective:    Physical Exam  Constitutional: He is oriented to person, place, and time. He appears well-developed and well-nourished. No distress.  HENT:  Head: Normocephalic and atraumatic.  Right Ear: External ear normal.  Left Ear: External ear normal.  Mouth/Throat: Oropharynx is clear and moist. No oropharyngeal exudate.  Slight nasal congestion  Eyes: Conjunctivae and EOM are normal. Pupils are equal, round, and reactive to light. Right eye exhibits no discharge. Left eye exhibits no discharge. No scleral icterus.  Neck: Normal range of motion. Neck supple. No tracheal deviation present. No thyromegaly present.  No thyromegaly or anterior cervical adenopathy  Cardiovascular: Normal rate, regular rhythm, normal heart sounds and intact distal pulses.   No murmur heard. The heart is regular at 72/m  Pulmonary/Chest: Effort normal and breath sounds normal. No respiratory distress. He has no wheezes. He has no rales. He exhibits no tenderness.  Abdominal: Soft. Bowel sounds are normal. He exhibits no mass. There is no tenderness. There is no rebound and no guarding.  Obese without masses or tenderness  Musculoskeletal: Normal range of motion. He exhibits no edema.  Lymphadenopathy:    He has no cervical adenopathy.  Neurological: He is alert and oriented to person, place, and time. He has normal reflexes. No cranial nerve deficit.  Skin: Skin is warm and dry. No rash  noted.  Psychiatric: He has a normal mood and affect. His behavior is normal. Judgment and thought content normal.  Nursing note and vitals reviewed.  BP 134/91 mmHg  Pulse 74  Temp(Src) 97.4 F (36.3 C) (Oral)  Ht 6' 3"  (1.905 m)  Wt 324 lb (146.965 kg)  BMI 40.50 kg/m2  Blood pressure recheck in the right arm with a large cuff was 128/92      Assessment & Plan:  1.  Dyslipidemia -Continue with weight loss and diet habits and exercise - CBC with Differential/Platelet - Lipid panel - Hepatic function panel  2. Morbid obesity due to excess calories ([place]) -Continue drinking more water and follow up with clinical pharmacy as planned - CBC with Differential/Platelet  3. Metabolic syndrome -Continue above - BMP8+EGFR - CBC with Differential/Platelet  4. Gastroesophageal reflux disease, esophagitis presence not specified -Continue Nexium as patient has no complaints with reflux symptoms today. - CBC with Differential/Platelet - Hepatic function panel  5. Health care maintenance -He will receive a flu shot and a Tdap Today. - VITAMIN D 25 Hydroxy (Vit-D Deficiency, Fractures) - Thyroid Panel With TSH  Patient Instructions  Continue current medications. Continue good therapeutic lifestyle changes which include good diet and exercise. Fall precautions discussed with patient. If an FOBT was given today- please return it to our front desk. If you are over 70 years old - you may need Prevnar 1 or the adult Pneumonia vaccine.  **Flu shots are available--- please call and schedule a FLU-CLINIC appointment**  After your visit with [place] today you will receive a survey in the mail or online from [employer] regarding your care with [place]. Please take a moment to fill this out. Your feedback is very important to [place] as you can help [place] better understand your patient needs as well as improve your experience and satisfaction. WE CARE ABOUT YOU!!!   The patient is to be applauded for losing his weight is down 8 more pounds since November 7 and I recommend that he continue to follow-up with the clinical pharmacists and continue to work on his weight loss He should continue to drink plenty of water and stay as active physically as possible He should make an appointment to be seen by the ophthalmologist for a routine eye exam He should watch his sodium intake  closely   Arrie Senate MD

## 2015-06-08 NOTE — Patient Instructions (Addendum)
Continue current medications. Continue good therapeutic lifestyle changes which include good diet and exercise. Fall precautions discussed with patient. If an FOBT was given today- please return it to our front desk. If you are over 37 years old - you may need Prevnar 76 or the adult Pneumonia vaccine.  **Flu shots are available--- please call and schedule a FLU-CLINIC appointment**  After your visit with [place] today you will receive a survey in the mail or online from [employer] regarding your care with [place]. Please take a moment to fill this out. Your feedback is very important to [place] as you can help [place] better understand your patient needs as well as improve your experience and satisfaction. WE CARE ABOUT YOU!!!   The patient is to be applauded for losing his weight is down 8 more pounds since November 7 and I recommend that he continue to follow-up with the clinical pharmacists and continue to work on his weight loss He should continue to drink plenty of water and stay as active physically as possible He should make an appointment to be seen by the ophthalmologist for a routine eye exam He should watch his sodium intake closely

## 2015-06-09 DIAGNOSIS — Z23 Encounter for immunization: Secondary | ICD-10-CM

## 2015-06-09 LAB — CBC WITH DIFFERENTIAL/PLATELET
BASOS: 0 %
Basophils Absolute: 0 10*3/uL (ref 0.0–0.2)
EOS (ABSOLUTE): 0.4 10*3/uL (ref 0.0–0.4)
EOS: 4 %
HEMATOCRIT: 44.1 % (ref 37.5–51.0)
HEMOGLOBIN: 14.9 g/dL (ref 12.6–17.7)
IMMATURE GRANS (ABS): 0 10*3/uL (ref 0.0–0.1)
IMMATURE GRANULOCYTES: 0 %
LYMPHS: 23 %
Lymphocytes Absolute: 2.2 10*3/uL (ref 0.7–3.1)
MCH: 27.9 pg (ref 26.6–33.0)
MCHC: 33.8 g/dL (ref 31.5–35.7)
MCV: 82 fL (ref 79–97)
MONOCYTES: 8 %
Monocytes Absolute: 0.7 10*3/uL (ref 0.1–0.9)
NEUTROS PCT: 65 %
Neutrophils Absolute: 6.2 10*3/uL (ref 1.4–7.0)
Platelets: 317 10*3/uL (ref 150–379)
RBC: 5.35 x10E6/uL (ref 4.14–5.80)
RDW: 14.1 % (ref 12.3–15.4)
WBC: 9.6 10*3/uL (ref 3.4–10.8)

## 2015-06-09 LAB — BMP8+EGFR
BUN/Creatinine Ratio: 16 (ref 8–19)
BUN: 12 mg/dL (ref 6–20)
CALCIUM: 9.7 mg/dL (ref 8.7–10.2)
CO2: 25 mmol/L (ref 18–29)
CREATININE: 0.77 mg/dL (ref 0.76–1.27)
Chloride: 98 mmol/L (ref 97–106)
GFR, EST AFRICAN AMERICAN: 134 mL/min/{1.73_m2} (ref >59)
GFR, EST NON AFRICAN AMERICAN: 116 mL/min/{1.73_m2} (ref >59)
Glucose: 77 mg/dL (ref 65–99)
Potassium: 4.1 mmol/L (ref 3.5–5.2)
Sodium: 140 mmol/L (ref 136–144)

## 2015-06-09 LAB — HEPATIC FUNCTION PANEL
ALT: 63 IU/L — AB (ref 0–44)
AST: 42 IU/L — ABNORMAL HIGH (ref 0–40)
Albumin: 4.6 g/dL (ref 3.5–5.5)
Alkaline Phosphatase: 92 IU/L (ref 39–117)
BILIRUBIN, DIRECT: 0.14 mg/dL (ref 0.00–0.40)
Bilirubin Total: 0.5 mg/dL (ref 0.0–1.2)
Total Protein: 7.3 g/dL (ref 6.0–8.5)

## 2015-06-09 LAB — LIPID PANEL
CHOL/HDL RATIO: 7 ratio — AB (ref 0.0–5.0)
Cholesterol, Total: 225 mg/dL — ABNORMAL HIGH (ref 100–199)
HDL: 32 mg/dL — AB (ref >39)
LDL CALC: 143 mg/dL — AB (ref 0–99)
TRIGLYCERIDES: 249 mg/dL — AB (ref 0–149)
VLDL CHOLESTEROL CAL: 50 mg/dL — AB (ref 5–40)

## 2015-06-09 LAB — VITAMIN D 25 HYDROXY (VIT D DEFICIENCY, FRACTURES): VIT D 25 HYDROXY: 20.5 ng/mL — AB (ref 30.0–100.0)

## 2015-06-09 LAB — THYROID PANEL WITH TSH
Free Thyroxine Index: 2.9 (ref 1.2–4.9)
T3 UPTAKE RATIO: 27 % (ref 24–39)
T4, Total: 10.8 ug/dL (ref 4.5–12.0)
TSH: 5.4 u[IU]/mL — AB (ref 0.450–4.500)

## 2015-06-10 ENCOUNTER — Other Ambulatory Visit: Payer: Self-pay | Admitting: *Deleted

## 2015-06-10 DIAGNOSIS — Z23 Encounter for immunization: Secondary | ICD-10-CM | POA: Diagnosis not present

## 2015-06-10 MED ORDER — VITAMIN D (ERGOCALCIFEROL) 1.25 MG (50000 UNIT) PO CAPS: 50000.0000 [IU] | ORAL_CAPSULE | ORAL | Status: DC

## 2015-06-10 MED ORDER — ATORVASTATIN CALCIUM 20 MG PO TABS: 20.0000 mg | ORAL_TABLET | Freq: Every day | ORAL | Status: DC

## 2015-06-10 NOTE — Addendum Note (Signed)
Addended by: Zannie Cove on: 06/10/2015 12:06 PM   Modules accepted: Orders

## 2015-08-10 ENCOUNTER — Other Ambulatory Visit: Payer: Self-pay | Admitting: Family Medicine

## 2015-10-19 ENCOUNTER — Ambulatory Visit (INDEPENDENT_AMBULATORY_CARE_PROVIDER_SITE_OTHER): Payer: BLUE CROSS/BLUE SHIELD | Admitting: Family Medicine

## 2015-10-19 ENCOUNTER — Encounter (INDEPENDENT_AMBULATORY_CARE_PROVIDER_SITE_OTHER): Payer: Self-pay

## 2015-10-19 ENCOUNTER — Encounter: Payer: Self-pay | Admitting: Family Medicine

## 2015-10-19 VITALS — BP 122/88 | HR 62 | Temp 97.1°F | Ht 75.0 in | Wt 326.0 lb

## 2015-10-19 DIAGNOSIS — K219 Gastro-esophageal reflux disease without esophagitis: Secondary | ICD-10-CM | POA: Diagnosis not present

## 2015-10-19 DIAGNOSIS — E785 Hyperlipidemia, unspecified: Secondary | ICD-10-CM

## 2015-10-19 DIAGNOSIS — E559 Vitamin D deficiency, unspecified: Secondary | ICD-10-CM | POA: Diagnosis not present

## 2015-10-19 DIAGNOSIS — C73 Malignant neoplasm of thyroid gland: Secondary | ICD-10-CM

## 2015-10-19 NOTE — Progress Notes (Signed)
Subjective:    Patient ID: Jay Lang, male    DOB: October 22, 1975, 40 y.o.   MRN: [id number]  HPI Pt here for follow up and management of chronic medical problems which includes vit d def and hyperlipidemia. He is taking most medications regularly.The patient comes to the visit today with no complaints. His weight is up 2 pounds. His body mass index is still greater than 40. After his initial blood pressure check a recheck had 140/88. The patient is doing well overall. He is not doing well with his weight loss. He also indicates he has not been taking Lipitor on a regular basis. He says he's been taking it may be less than half of the time. I told him how important it was to take it regularly because we did not know how to adjust the statin and the resulting lab work when he has not been taking it regularly. He understands this. In the future he will take at least a half a one a day until rechecked the next lab work. He will continue to [place] his foods and a steamed fashion which is healthy for him. He will continue to try to drink more water and avoid sugar. He denies any chest pain or shortness of breath. He's not having any trouble with his GI tract with swallowing heartburn indigestion nausea vomiting diarrhea or blood in the stool. He is passing his water without problems. He does admit to not getting enough exercise and he plans to do better with this and [facility] [place] works.      Patient Active Problem List   Diagnosis Date Noted  . Metabolic syndrome 78/29/5621  . Severe obesity (BMI >= 40) ([place]) 09/17/2014  . Dyslipidemia 07/30/2014  . Thyroid cancer, multifocal papillary carcinoma, T1a, N0, Mx 03/19/2011   Outpatient Encounter Prescriptions as of 10/19/2015  Medication Sig  . atorvastatin (LIPITOR) 20 MG tablet Take 1 tablet (20 mg total) by mouth daily.  Marland Kitchen levothyroxine (SYNTHROID, LEVOTHROID) 125 MCG tablet Take 250 mcg by mouth  daily.  Marland Kitchen NEXIUM 40 MG capsule TAKE ONE CAPSULE BY MOUTH EVERY DAY  . Vitamin D, Ergocalciferol, (DRISDOL) 50000 UNITS CAPS capsule Take 1 capsule (50,000 Units total) by mouth every 7 (seven) days.  . [DISCONTINUED] Tdap (BOOSTRIX) 5-2.5-18.5 LF-MCG/0.5 injection Inject 0.5 mLs into the muscle once.   No facility-administered encounter medications on file as of 10/19/2015.      Review of Systems  Constitutional: Negative.   HENT: Negative.   Eyes: Negative.   Respiratory: Negative.   Cardiovascular: Negative.   Gastrointestinal: Negative.   Endocrine: Negative.   Genitourinary: Negative.   Musculoskeletal: Negative.   Skin: Negative.   Allergic/Immunologic: Negative.   Neurological: Negative.   Hematological: Negative.   Psychiatric/Behavioral: Negative.        Objective:   Physical Exam  Constitutional: He is oriented to person, place, and time. He appears well-developed and well-nourished. No distress.  HENT:  Head: Normocephalic and atraumatic.  Right Ear: External ear normal.  Left Ear: External ear normal.  Nose: Nose normal.  Mouth/Throat: Oropharynx is clear and moist. No oropharyngeal exudate.  Eyes: Conjunctivae and EOM are normal. Pupils are equal, round, and reactive to light. Right eye exhibits no discharge. Left eye exhibits no discharge. No scleral icterus.  Neck: Normal range of motion. Neck supple. No thyromegaly present.  Cardiovascular: Normal rate, regular rhythm, normal heart sounds and intact distal pulses.  No murmur heard. Pulmonary/Chest: Effort normal and breath sounds normal. No respiratory distress. He has no wheezes. He has no rales. He exhibits no tenderness.  Abdominal: Soft. Bowel sounds are normal. He exhibits no mass. There is no tenderness. There is no rebound and no guarding.  Musculoskeletal: Normal range of motion. He exhibits no edema or tenderness.  Lymphadenopathy:    He has no cervical adenopathy.  Neurological: He is alert and  oriented to person, place, and time. He has normal reflexes. No cranial nerve deficit.  Skin: Skin is warm and dry. No rash noted.  Psychiatric: He has a normal mood and affect. His behavior is normal. Judgment and thought content normal.  Nursing note and vitals reviewed.   BP 140/88 mmHg  Pulse 62  Temp(Src) 97.1 F (36.2 C) (Oral)  Ht 6' 3" (1.905 m)  Wt 326 lb (147.873 kg)  BMI 40.75 kg/m2       Assessment & Plan:  1. Dyslipidemia -Take 10 mg of atorvastatin every night and do this regularly so that we check your next lab work we will know how to gauge the medication and its results with lowering the cholesterol - BMP8+EGFR; Future - CBC with Differential/Platelet; Future - Hepatic function panel; Future - NMR, lipoprofile; Future  2. Gastroesophageal reflux disease, esophagitis presence not specified -The patient is having no complaints with this - CBC with Differential/Platelet; Future - Hepatic function panel; Future  3. Thyroid cancer [facility]) -The patient will continue to follow-up with his endocrinologist on a regular basis - CBC with Differential/Platelet; Future  4. Morbid obesity due to excess calories ([place]) -Consider joining an exercise program and at the exercise part to the diet or - CBC with Differential/Platelet; Future - NMR, lipoprofile; Future  5. Vitamin D deficiency -Continue current treatment pending results of lab work - VITAMIN D 25 Hydroxy (Vit-D Deficiency, Fractures); Future  Patient Instructions  Continue current medications. Continue good therapeutic lifestyle changes which include good diet and exercise. Fall precautions discussed with patient. If an FOBT was given today- please return it to our front desk. If you are over 20 years old - you may need Prevnar 27 or the adult Pneumonia vaccine.  **Flu shots are available--- please call and schedule a FLU-CLINIC appointment**  After your visit with [place] today you will receive a survey in the  mail or online from [employer] regarding your care with [place]. Please take a moment to fill this out. Your feedback is very important to [place] as you can help [place] better understand your patient needs as well as improve your experience and satisfaction. WE CARE ABOUT YOU!!!    Try and get into a exercise  program  Drink plenty of fluids Return fasting for labwork Continue to follow-up with the endocrinologist   Arrie Senate MD

## 2015-10-19 NOTE — Patient Instructions (Addendum)
Continue current medications. Continue good therapeutic lifestyle changes which include good diet and exercise. Fall precautions discussed with patient. If an FOBT was given today- please return it to our front desk. If you are over 38 years old - you may need Prevnar 28 or the adult Pneumonia vaccine.  **Flu shots are available--- please call and schedule a FLU-CLINIC appointment**  After your visit with [place] today you will receive a survey in the mail or online from [employer] regarding your care with [place]. Please take a moment to fill this out. Your feedback is very important to [place] as you can help [place] better understand your patient needs as well as improve your experience and satisfaction. WE CARE ABOUT YOU!!!    Try and get into a exercise  program  Drink plenty of fluids Return fasting for labwork Continue to follow-up with the endocrinologist

## 2015-12-05 ENCOUNTER — Encounter: Payer: Self-pay | Admitting: *Deleted

## 2016-01-31 ENCOUNTER — Other Ambulatory Visit: Payer: Self-pay | Admitting: Family Medicine

## 2016-02-09 ENCOUNTER — Encounter: Payer: Self-pay | Admitting: *Deleted

## 2016-02-23 ENCOUNTER — Ambulatory Visit: Payer: BLUE CROSS/BLUE SHIELD | Admitting: Family Medicine

## 2016-03-06 ENCOUNTER — Ambulatory Visit (INDEPENDENT_AMBULATORY_CARE_PROVIDER_SITE_OTHER): Payer: BLUE CROSS/BLUE SHIELD | Admitting: Family Medicine

## 2016-03-06 ENCOUNTER — Encounter: Payer: Self-pay | Admitting: Family Medicine

## 2016-03-06 VITALS — BP 113/66 | HR 71 | Temp 97.8°F | Ht 75.0 in | Wt 334.0 lb

## 2016-03-06 DIAGNOSIS — E782 Mixed hyperlipidemia: Secondary | ICD-10-CM

## 2016-03-06 DIAGNOSIS — K219 Gastro-esophageal reflux disease without esophagitis: Secondary | ICD-10-CM

## 2016-03-06 DIAGNOSIS — Z0000 Encounter for general adult medical examination without abnormal findings: Secondary | ICD-10-CM

## 2016-03-06 DIAGNOSIS — E559 Vitamin D deficiency, unspecified: Secondary | ICD-10-CM

## 2016-03-06 DIAGNOSIS — C73 Malignant neoplasm of thyroid gland: Secondary | ICD-10-CM

## 2016-03-06 LAB — URINALYSIS, COMPLETE
Bilirubin, UA: NEGATIVE
GLUCOSE, UA: NEGATIVE
LEUKOCYTES UA: NEGATIVE
NITRITE UA: NEGATIVE
Protein, UA: NEGATIVE
SPEC GRAV UA: 1.025 (ref 1.005–1.030)
Urobilinogen, Ur: 0.2 mg/dL (ref 0.2–1.0)
pH, UA: 6 (ref 5.0–7.5)

## 2016-03-06 LAB — MICROSCOPIC EXAMINATION
Bacteria, UA: NONE SEEN
Epithelial Cells (non renal): NONE SEEN /hpf (ref 0–10)
WBC, UA: NONE SEEN /hpf (ref 0–?)

## 2016-03-06 MED ORDER — NAFTIFINE HCL 1 % EX CREA: TOPICAL_CREAM | Freq: Every day | CUTANEOUS | 3 refills | Status: DC

## 2016-03-06 NOTE — Patient Instructions (Addendum)
Continue current medications. Continue good therapeutic lifestyle changes which include good diet and exercise. Fall precautions discussed with patient. If an FOBT was given today- please return it to our front desk.   After your visit with [place] today you will receive a survey in the mail or online from [employer] regarding your care with [place]. Please take a moment to fill this out. Your feedback is very important to [place] as you can help [place] better understand your patient needs as well as improve your experience and satisfaction. WE CARE ABOUT YOU!!!   Patient is encouraged to follow as aggressive therapeutic lifestyle changes as possible which include his diet and exercise. He must be losing weight instead of gaining weight. He must try to become motivated to exercise more and eat less and avoid beverages that are high in calories.

## 2016-03-06 NOTE — Addendum Note (Signed)
Addended by: Zannie Cove on: 03/06/2016 05:08 PM   Modules accepted: Orders

## 2016-03-06 NOTE — Progress Notes (Signed)
Subjective:    Patient ID: Jay Lang, male    DOB: [birth year], 40 y.o.   MRN: [id number]  HPI Patient is here today for annual wellness exam and follow up of chronic medical problems which includes hyperlipidemia. He is taking medications regularly.The patient is doing well overall other than complaining with some burning in the right jaw area. His vital signs are stable but his BMI remains elevated at 4.80. His weight is up 8 more pounds since the last visit.     Patient Active Problem List   Diagnosis Date Noted  . Metabolic syndrome 67/59/1638  . Severe obesity (BMI >= 40) ([place]) 09/17/2014  . Dyslipidemia 07/30/2014  . Thyroid cancer, multifocal papillary carcinoma, T1a, N0, Mx 03/19/2011   Outpatient Encounter Prescriptions as of 03/06/2016  Medication Sig  . atorvastatin (LIPITOR) 20 MG tablet Take 1 tablet (20 mg total) by mouth daily.  Marland Kitchen levothyroxine (SYNTHROID, LEVOTHROID) 125 MCG tablet Take 250 mcg by mouth daily.  Marland Kitchen NEXIUM 40 MG capsule TAKE ONE CAPSULE BY MOUTH EVERY DAY  . Vitamin D, Ergocalciferol, (DRISDOL) 50000 units CAPS capsule TAKE 1 CAPSULE (50,000 UNITS TOTAL) BY MOUTH EVERY 7 (SEVEN) DAYS.   No facility-administered encounter medications on file as of 03/06/2016.      Review of Systems  Constitutional: Negative.   HENT: Negative.  Negative for ear pain.        Right side jaw - burning sensation   Eyes: Negative.   Respiratory: Negative.   Cardiovascular: Negative.   Gastrointestinal: Negative.   Endocrine: Negative.   Genitourinary: Negative.   Musculoskeletal: Negative.  Negative for neck stiffness.  Skin: Negative.   Allergic/Immunologic: Negative.   Neurological: Negative.   Hematological: Negative.   Psychiatric/Behavioral: Negative.        Objective:   Physical Exam  Constitutional: He is oriented to person, place, and time. He appears well-developed and well-nourished. No distress.  Pleasant and alert but morbidly obese young man  HENT:    Head: Normocephalic and atraumatic.  Right Ear: External ear normal.  Left Ear: External ear normal.  Nose: Nose normal.  Mouth/Throat: Oropharynx is clear and moist. No oropharyngeal exudate.  Eyes: Conjunctivae and EOM are normal. Pupils are equal, round, and reactive to light. Right eye exhibits no discharge. Left eye exhibits no discharge. No scleral icterus.  Neck: Normal range of motion. Neck supple. No thyromegaly present.  No bruits or thyromegaly  Cardiovascular: Normal rate, regular rhythm, normal heart sounds and intact distal pulses.   No murmur heard. The heart is regular at 72/m  Pulmonary/Chest: Effort normal and breath sounds normal. No respiratory distress. He has no wheezes. He has no rales. He exhibits no tenderness.  Clear anteriorly and posteriorly without axillary  Abdominal: Soft. Bowel sounds are normal. He exhibits no mass. There is no tenderness. There is no rebound and no guarding.  Morbidly obese without liver or spleen enlargement bruits or masses.  Genitourinary: Rectum normal, prostate normal and penis normal.  Genitourinary Comments: The rectal exam was difficult due to his buttocks eyes. But no apparent enlargement of the prostate and no rectal masses were palpable. There were no inguinal hernias and the external genitalia was within normal limits.  Musculoskeletal: Normal range of motion. He exhibits no edema.  Lymphadenopathy:    He has no cervical adenopathy.  Neurological: He is alert and oriented to person, place, and time. He has normal reflexes. No cranial nerve deficit.  Skin: Skin is warm and dry. No rash  noted.  Psychiatric: He has a normal mood and affect. His behavior is normal. Judgment and thought content normal.  Nursing note and vitals reviewed.  BP 113/66 (BP Location: Left Arm)   Pulse 71   Temp 97.8 F (36.6 C) (Oral)   Ht _0  (1.905 m)   Wt (!) 334 lb (151.5 kg)   BMI 41.75 kg/m         Assessment & Plan:  1. Annual  physical exam -Continue with aggressive therapeutic lifestyle changes - BMP8+EGFR; Future - CBC with Differential/Platelet; Future - Hepatic function panel; Future - VITAMIN D 25 Hydroxy (Vit-D Deficiency, Fractures); Future - NMR, lipoprofile; Future - Urinalysis, Complete  2. Gastroesophageal reflux disease, esophagitis presence not specified -Continue with over-the-counter Nexium taking 2 daily -Continue to watch diet closely and avoid fried foods spiced foods and caffeine - CBC with Differential/Platelet; Future  3. Thyroid cancer ([place]) -Continue with thyroid replacement pending results of lab work - CBC with Differential/Platelet; Future  4. Vitamin D deficiency -Continue with vitamin D replacement pending results of lab work - CBC with Differential/Platelet; Future - VITAMIN D 25 Hydroxy (Vit-D Deficiency, Fractures); Future  5. Mixed hyperlipidemia -Continue with atorvastatin as currently doing pending results of lab work - BMP8+EGFR; Future - CBC with Differential/Platelet; Future - Hepatic function panel; Future - NMR, lipoprofile; Future  6. Morbid obesity due to excess calories ([place]) -I revisited the patient's diet and exercise regimen with him and the only other alternative would be some type of gastric bypass surgery. He is not interested in that. He will try to do better and try to become more motivated to lose weight.  Patient Instructions  Continue current medications. Continue good therapeutic lifestyle changes which include good diet and exercise. Fall precautions discussed with patient. If an FOBT was given today- please return it to our front desk.   After your visit with [place] today you will receive a survey in the mail or online from [employer] regarding your care with [place]. Please take a moment to fill this out. Your feedback is very important to [place] as you can help [place] better understand your patient needs as well as improve your experience and satisfaction.  WE CARE ABOUT YOU!!!   Patient is encouraged to follow as aggressive therapeutic lifestyle changes as possible which include his diet and exercise. He must be losing weight instead of gaining weight. He must try to become motivated to exercise more and eat less and avoid beverages that are high in calories.    Arrie Senate MD

## 2016-03-07 ENCOUNTER — Other Ambulatory Visit: Payer: Self-pay | Admitting: *Deleted

## 2016-03-07 DIAGNOSIS — R319 Hematuria, unspecified: Secondary | ICD-10-CM

## 2016-03-08 ENCOUNTER — Ambulatory Visit: Payer: BLUE CROSS/BLUE SHIELD | Admitting: Family Medicine

## 2016-05-04 ENCOUNTER — Other Ambulatory Visit: Payer: Self-pay | Admitting: Family Medicine

## 2016-06-18 ENCOUNTER — Telehealth: Payer: Self-pay | Admitting: Family Medicine

## 2016-06-18 NOTE — Telephone Encounter (Signed)
Pt will call back if he decides to get one.

## 2016-07-10 ENCOUNTER — Other Ambulatory Visit: Payer: Self-pay | Admitting: Family Medicine

## 2016-07-30 ENCOUNTER — Telehealth: Payer: Self-pay | Admitting: Family Medicine

## 2016-08-02 NOTE — Telephone Encounter (Signed)
Tried to call patient twice - left message on VM.

## 2016-09-05 ENCOUNTER — Ambulatory Visit (INDEPENDENT_AMBULATORY_CARE_PROVIDER_SITE_OTHER): Payer: BLUE CROSS/BLUE SHIELD

## 2016-09-05 ENCOUNTER — Ambulatory Visit (INDEPENDENT_AMBULATORY_CARE_PROVIDER_SITE_OTHER): Payer: BLUE CROSS/BLUE SHIELD | Admitting: Family Medicine

## 2016-09-05 ENCOUNTER — Encounter: Payer: Self-pay | Admitting: Family Medicine

## 2016-09-05 VITALS — BP 142/80 | HR 76 | Temp 98.2°F | Ht 75.0 in | Wt 331.0 lb

## 2016-09-05 DIAGNOSIS — E782 Mixed hyperlipidemia: Secondary | ICD-10-CM | POA: Diagnosis not present

## 2016-09-05 DIAGNOSIS — R030 Elevated blood-pressure reading, without diagnosis of hypertension: Secondary | ICD-10-CM

## 2016-09-05 DIAGNOSIS — C73 Malignant neoplasm of thyroid gland: Secondary | ICD-10-CM

## 2016-09-05 DIAGNOSIS — E559 Vitamin D deficiency, unspecified: Secondary | ICD-10-CM

## 2016-09-05 DIAGNOSIS — K219 Gastro-esophageal reflux disease without esophagitis: Secondary | ICD-10-CM

## 2016-09-05 NOTE — Progress Notes (Signed)
Subjective:    Patient ID: Jay Lang, male    DOB: 12-12-1975, 41 y.o.   MRN: [id number]  HPI Pt here for follow up and management of chronic medical problems which includes hyperlipidemia. He is taking medication regularly.This patient comes in today for his regular visit. He has diagnoses of morbid obesity hyperlipidemia and thyroid cancer. He also has vitamin D deficiency. He has no complaints today. He will come in for lab work this weekend. He is going to get a chest x-ray today and he has an FOBT at home that he is felt to return and promises to return this. His blood pressure initially was elevated at 156/94 and on recheck was 148/85. His weight is down 3 pounds from the previous visit and his body mass index is still 41.8. The patient did have a flulike illness several weeks ago and went to an urgent care and was treated with Tamiflu and an antibiotic. He completed all of this medication. He still has some residual cough and congestion but is getting better. During this period of time when he was sick he did have some shortness of breath and increased congestion in the chest. He also had some loose bowel movements. All of this has resolved or is resolving currently. He's passing his water without problems and he is not having any chest pain and the shortness of breath is less since he's gotten over this virus. The patient has had a history of thyroid cancer and he sees his endocrinologist regularly on a yearly basis, Dr. Soyla Murphy. The patient does say that his blood pressure is usually up when he first comes the office and is much better when he leaves office and we will recheck this again before he leaves.    Patient Active Problem List   Diagnosis Date Noted  . Metabolic syndrome 40/98/1191  . Severe obesity (BMI >= 40) ([place]) 09/17/2014  . Dyslipidemia 07/30/2014  . Thyroid cancer, multifocal papillary carcinoma, T1a, N0, Mx 03/19/2011   Outpatient Encounter Prescriptions as of 09/05/2016    Medication Sig  . atorvastatin (LIPITOR) 20 MG tablet Take 1 tablet (20 mg total) by mouth daily.  Marland Kitchen levothyroxine (SYNTHROID, LEVOTHROID) 125 MCG tablet Take 250 mcg by mouth daily.  . naftifine (NAFTIN) 1 % cream Apply topically daily.  Marland Kitchen NEXIUM 40 MG capsule TAKE ONE CAPSULE BY MOUTH EVERY DAY  . Vitamin D, Ergocalciferol, (DRISDOL) 50000 units CAPS capsule TAKE 1 CAPSULE (50,000 UNITS TOTAL) BY MOUTH EVERY 7 (SEVEN) DAYS.   No facility-administered encounter medications on file as of 09/05/2016.       Review of Systems  Constitutional: Negative.   HENT: Negative.   Eyes: Negative.   Respiratory: Negative.   Cardiovascular: Negative.   Gastrointestinal: Negative.   Endocrine: Negative.   Genitourinary: Negative.   Musculoskeletal: Negative.   Skin: Negative.   Allergic/Immunologic: Negative.   Neurological: Negative.   Hematological: Negative.   Psychiatric/Behavioral: Negative.        Objective:   Physical Exam  Constitutional: He is oriented to person, place, and time. He appears well-developed and well-nourished. No distress.  Kind and pleasant  HENT:  Head: Normocephalic and atraumatic.  Right Ear: External ear normal.  Left Ear: External ear normal.  Nose: Nose normal.  Mouth/Throat: Oropharynx is clear and moist. No oropharyngeal exudate.  Eyes: Conjunctivae and EOM are normal. Pupils are equal, round, and reactive to light. Right eye exhibits no discharge. Left eye exhibits no discharge. No scleral icterus.  Neck:  Normal range of motion. Neck supple. No thyromegaly present.  Thyromegaly or masses in the neck or bruits not present  Cardiovascular: Normal rate, regular rhythm, normal heart sounds and intact distal pulses.   No murmur heard. Heart has a regular rate and rhythm at 72/m  Pulmonary/Chest: Effort normal and breath sounds normal. No respiratory distress. He has no wheezes. He has no rales. He exhibits no tenderness.  Clear anteriorly and posteriorly  and no axillary adenopathy  Abdominal: Soft. Bowel sounds are normal. He exhibits no mass. There is no tenderness. There is no rebound and no guarding.  Abdominal obesity without masses or organ enlargement or bruits or tenderness  Musculoskeletal: Normal range of motion. He exhibits no edema.  Lymphadenopathy:    He has no cervical adenopathy.  Neurological: He is alert and oriented to person, place, and time. He has normal reflexes. No cranial nerve deficit.  Skin: Skin is warm and dry. No rash noted.  Psychiatric: He has a normal mood and affect. His behavior is normal. Judgment and thought content normal.  Nursing note and vitals reviewed.   BP (!) 148/85   Pulse 76   Temp 98.2 F (36.8 C) (Oral)   Ht 6' 3"  (1.905 m)   Wt (!) 331 lb (150.1 kg)   BMI 41.37 kg/m        Assessment & Plan:  1. Gastroesophageal reflux disease, esophagitis presence not specified -The patient had no complaints with heartburn or indigestion he will continue to take his Nexium - BMP8+EGFR; Future - CBC with Differential/Platelet; Future - Hepatic function panel; Future  2. Thyroid cancer [facility]) -He will follow-up with his endocrinologist in April - CBC with Differential/Platelet; Future  3. Vitamin D deficiency -Continue current treatment pending results of lab work - CBC with Differential/Platelet; Future - VITAMIN D 25 Hydroxy (Vit-D Deficiency, Fractures); Future  4. Mixed hyperlipidemia -Work on losing weight and follow-up aggressive therapeutic lifestyle changes - BMP8+EGFR; Future - CBC with Differential/Platelet; Future - Lipid panel; Future - DG Chest 2 View; Future  5. Elevated blood pressure reading without diagnosis of hypertension -Drink more water, lose weight. Watch sodium intake.  Patient Instructions  Continue current medications. Continue good therapeutic lifestyle changes which include good diet and exercise. Fall precautions discussed with patient. If an FOBT was  given today- please return it to our front desk. If you are over 1 years old - you may need Prevnar 52 or the adult Pneumonia vaccine.  **Flu shots are available--- please call and schedule a FLU-CLINIC appointment**  After your visit with [place] today you will receive a survey in the mail or online from [employer] regarding your care with [place]. Please take a moment to fill this out. Your feedback is very important to [place] as you can help [place] better understand your patient needs as well as improve your experience and satisfaction. WE CARE ABOUT YOU!!!   Continue to drink more water and less soda drinks Consider trying [place] Use Stevia and not sugar Exercise regularly Check blood pressures at home and bring readings by for review and 3-4 weeks Return FOBT  Arrie Senate MD   .

## 2016-09-05 NOTE — Patient Instructions (Addendum)
Continue current medications. Continue good therapeutic lifestyle changes which include good diet and exercise. Fall precautions discussed with patient. If an FOBT was given today- please return it to our front desk. If you are over 39 years old - you may need Prevnar 18 or the adult Pneumonia vaccine.  **Flu shots are available--- please call and schedule a FLU-CLINIC appointment**  After your visit with [place] today you will receive a survey in the mail or online from [employer] regarding your care with [place]. Please take a moment to fill this out. Your feedback is very important to [place] as you can help [place] better understand your patient needs as well as improve your experience and satisfaction. WE CARE ABOUT YOU!!!   Continue to drink more water and less soda drinks Consider trying [place]ro[place] Use Stevia and not sugar Exercise regularly Check blood pressures at home and bring readings by for review and 3-4 weeks Return FOBT

## 2016-09-08 ENCOUNTER — Other Ambulatory Visit (INDEPENDENT_AMBULATORY_CARE_PROVIDER_SITE_OTHER): Payer: BLUE CROSS/BLUE SHIELD

## 2016-09-08 DIAGNOSIS — E559 Vitamin D deficiency, unspecified: Secondary | ICD-10-CM

## 2016-09-08 DIAGNOSIS — K219 Gastro-esophageal reflux disease without esophagitis: Secondary | ICD-10-CM

## 2016-09-08 DIAGNOSIS — Z1211 Encounter for screening for malignant neoplasm of colon: Secondary | ICD-10-CM

## 2016-09-08 DIAGNOSIS — E782 Mixed hyperlipidemia: Secondary | ICD-10-CM

## 2016-09-08 DIAGNOSIS — C73 Malignant neoplasm of thyroid gland: Secondary | ICD-10-CM

## 2016-09-10 LAB — LIPID PANEL
CHOL/HDL RATIO: 5.5 ratio — AB (ref 0.0–5.0)
Cholesterol, Total: 187 mg/dL (ref 100–199)
HDL: 34 mg/dL — AB (ref >39)
LDL CALC: 113 mg/dL — AB (ref 0–99)
Triglycerides: 201 mg/dL — ABNORMAL HIGH (ref 0–149)
VLDL CHOLESTEROL CAL: 40 mg/dL (ref 5–40)

## 2016-09-10 LAB — CBC WITH DIFFERENTIAL/PLATELET
BASOS ABS: 0 10*3/uL (ref 0.0–0.2)
BASOS: 1 %
EOS (ABSOLUTE): 0.3 10*3/uL (ref 0.0–0.4)
Eos: 4 %
HEMOGLOBIN: 15 g/dL (ref 13.0–17.7)
Hematocrit: 45.5 % (ref 37.5–51.0)
IMMATURE GRANS (ABS): 0 10*3/uL (ref 0.0–0.1)
IMMATURE GRANULOCYTES: 0 %
LYMPHS: 27 %
Lymphocytes Absolute: 1.8 10*3/uL (ref 0.7–3.1)
MCH: 27.3 pg (ref 26.6–33.0)
MCHC: 33 g/dL (ref 31.5–35.7)
MCV: 83 fL (ref 79–97)
MONOCYTES: 6 %
Monocytes Absolute: 0.4 10*3/uL (ref 0.1–0.9)
NEUTROS ABS: 4.1 10*3/uL (ref 1.4–7.0)
NEUTROS PCT: 62 %
PLATELETS: 359 10*3/uL (ref 150–379)
RBC: 5.49 x10E6/uL (ref 4.14–5.80)
RDW: 13.8 % (ref 12.3–15.4)
WBC: 6.6 10*3/uL (ref 3.4–10.8)

## 2016-09-10 LAB — HEPATIC FUNCTION PANEL
ALK PHOS: 86 IU/L (ref 39–117)
ALT: 64 IU/L — ABNORMAL HIGH (ref 0–44)
AST: 32 IU/L (ref 0–40)
Albumin: 4.3 g/dL (ref 3.5–5.5)
Bilirubin Total: 0.6 mg/dL (ref 0.0–1.2)
Bilirubin, Direct: 0.17 mg/dL (ref 0.00–0.40)
TOTAL PROTEIN: 6.9 g/dL (ref 6.0–8.5)

## 2016-09-10 LAB — BMP8+EGFR
BUN/Creatinine Ratio: 13 (ref 9–20)
BUN: 10 mg/dL (ref 6–20)
CALCIUM: 9.2 mg/dL (ref 8.7–10.2)
CHLORIDE: 99 mmol/L (ref 96–106)
CO2: 24 mmol/L (ref 18–29)
Creatinine, Ser: 0.75 mg/dL — ABNORMAL LOW (ref 0.76–1.27)
GFR, EST AFRICAN AMERICAN: 134 mL/min/{1.73_m2} (ref >59)
GFR, EST NON AFRICAN AMERICAN: 116 mL/min/{1.73_m2} (ref >59)
Glucose: 84 mg/dL (ref 65–99)
Potassium: 4.4 mmol/L (ref 3.5–5.2)
Sodium: 139 mmol/L (ref 134–144)

## 2016-09-10 LAB — THYROID PANEL WITH TSH
FREE THYROXINE INDEX: 2.9 (ref 1.2–4.9)
T3 Uptake Ratio: 30 % (ref 24–39)
T4 TOTAL: 9.7 ug/dL (ref 4.5–12.0)
TSH: 0.768 u[IU]/mL (ref 0.450–4.500)

## 2016-09-10 LAB — VITAMIN D 25 HYDROXY (VIT D DEFICIENCY, FRACTURES): VIT D 25 HYDROXY: 29.8 ng/mL — AB (ref 30.0–100.0)

## 2016-09-11 LAB — FECAL OCCULT BLOOD, IMMUNOCHEMICAL: FECAL OCCULT BLD: NEGATIVE

## 2016-10-06 ENCOUNTER — Other Ambulatory Visit: Payer: Self-pay | Admitting: Family Medicine

## 2017-01-07 ENCOUNTER — Other Ambulatory Visit: Payer: Self-pay | Admitting: Family Medicine

## 2017-01-07 NOTE — Telephone Encounter (Signed)
Last Vit D 09/08/16  29.8  DWM

## 2017-02-07 ENCOUNTER — Other Ambulatory Visit: Payer: Self-pay | Admitting: Nurse Practitioner

## 2017-03-07 ENCOUNTER — Encounter: Payer: Self-pay | Admitting: Family Medicine

## 2017-03-07 ENCOUNTER — Ambulatory Visit (INDEPENDENT_AMBULATORY_CARE_PROVIDER_SITE_OTHER): Payer: BLUE CROSS/BLUE SHIELD | Admitting: Family Medicine

## 2017-03-07 VITALS — BP 138/88 | HR 69 | Temp 97.2°F | Ht 75.0 in | Wt 343.0 lb

## 2017-03-07 DIAGNOSIS — R030 Elevated blood-pressure reading, without diagnosis of hypertension: Secondary | ICD-10-CM

## 2017-03-07 DIAGNOSIS — C73 Malignant neoplasm of thyroid gland: Secondary | ICD-10-CM

## 2017-03-07 DIAGNOSIS — Z0000 Encounter for general adult medical examination without abnormal findings: Secondary | ICD-10-CM | POA: Diagnosis not present

## 2017-03-07 DIAGNOSIS — K219 Gastro-esophageal reflux disease without esophagitis: Secondary | ICD-10-CM

## 2017-03-07 DIAGNOSIS — E782 Mixed hyperlipidemia: Secondary | ICD-10-CM

## 2017-03-07 DIAGNOSIS — E559 Vitamin D deficiency, unspecified: Secondary | ICD-10-CM

## 2017-03-07 NOTE — Patient Instructions (Addendum)
Continue current medications. Continue good therapeutic lifestyle changes which include good diet and exercise. Fall precautions discussed with patient. If an FOBT was given today- please return it to our front desk.   **Flu shots are available--- please call and schedule a FLU-CLINIC appointment**  After your visit with [place] today you will receive a survey in the mail or online from [employer] regarding your care with [place]. Please take a moment to fill this out. Your feedback is very important to [place] as you can help [place] better understand your patient needs as well as improve your experience and satisfaction. WE CARE ABOUT YOU!!!   You should call and schedule a eye exam - [facility] associates in [place] or Dr Duaine Dredge office  Return fasting for labs Continue to try and loose weight Drink plenty of water

## 2017-03-07 NOTE — Progress Notes (Signed)
Subjective:    Patient ID: Jay Lang, male    DOB: 02-28-1976, 41 y.o.   MRN: [id number]  HPI Patient is here today for annual wellness exam and follow up of chronic medical problems which includes hyperlipidemia. He is taking medication regularly.The patient is doing well with no specific complaints. His weight is up even higher than it was previously by 6 pounds at 343 pounds. His body mass index is 42.12. His blood pressures also elevated today. The patient does not need any refills. He is due to a rectal exam and prostate exam today and will return to the office fasting for his blood work with a PSA. We will get a urinalysis today. The patient denies any chest pain or shortness of breath. He is still working for [employer] which used to be [employer]. He does have occasional heartburn and especially bad at night if he does not take his Nexium for a few days in a row. He understands the importance of taking this regularly. He otherwise denies any trouble with nausea vomiting diarrhea blood in the stool or black tarry bowel movements. Asking his water without problems. We did talk about the weight gain and he understands the importance to reduce his drinks and drink more water and eat less carbs. He also is in need of an eye exam and we will give him some information about following up on this.    Patient Active Problem List   Diagnosis Date Noted  . Metabolic syndrome 44/96/7591  . Severe obesity (BMI >= 40) ([place]) 09/17/2014  . Dyslipidemia 07/30/2014  . Thyroid cancer, multifocal papillary carcinoma, T1a, N0, Mx 03/19/2011   Outpatient Encounter Prescriptions as of 03/07/2017  Medication Sig  . levothyroxine (SYNTHROID, LEVOTHROID) 125 MCG tablet Take 250 mcg by mouth daily.  Marland Kitchen NEXIUM 40 MG capsule TAKE ONE CAPSULE BY MOUTH EVERY DAY  . Vitamin D, Ergocalciferol, (DRISDOL) 50000 units CAPS capsule TAKE 1 CAPSULE (50,000 UNITS TOTAL) BY MOUTH EVERY 7 (SEVEN) DAYS.  Marland Kitchen atorvastatin (LIPITOR) 20  MG tablet Take 1 tablet (20 mg total) by mouth daily. (Patient not taking: Reported on 03/07/2017)  . [DISCONTINUED] naftifine (NAFTIN) 1 % cream Apply topically daily.   No facility-administered encounter medications on file as of 03/07/2017.       Review of Systems  Constitutional: Negative.   HENT: Negative.   Eyes: Negative.   Respiratory: Negative.   Cardiovascular: Negative.   Gastrointestinal: Negative.   Endocrine: Negative.   Genitourinary: Negative.   Musculoskeletal: Negative.   Skin: Negative.   Allergic/Immunologic: Negative.   Neurological: Negative.   Hematological: Negative.   Psychiatric/Behavioral: Negative.        Objective:   Physical Exam  Constitutional: He is oriented to person, place, and time. He appears well-developed and well-nourished. No distress.  The patient is kind and pleasant and relaxed  HENT:  Head: Normocephalic and atraumatic.  Right Ear: External ear normal.  Left Ear: External ear normal.  Nose: Nose normal.  Mouth/Throat: Oropharynx is clear and moist. No oropharyngeal exudate.  Eyes: Pupils are equal, round, and reactive to light. Conjunctivae and EOM are normal. Right eye exhibits no discharge. Left eye exhibits no discharge. No scleral icterus.  Neck: Normal range of motion. Neck supple. No thyromegaly present.  No bruits or thyromegaly  Cardiovascular: Normal rate, regular rhythm, normal heart sounds and intact distal pulses.   No murmur heard. The heart is regular at 72/m  Pulmonary/Chest: Effort normal and breath sounds normal. No  respiratory distress. He has no wheezes. He has no rales. He exhibits no tenderness.  Clear anteriorly and posteriorly no axillary adenopathy  Abdominal: Soft. Bowel sounds are normal. He exhibits no mass. There is no tenderness. There is no rebound and no guarding.  We'll gladly obese. Abdominal obesity without masses tenderness or organ enlargement or bruits  Genitourinary: Rectum normal, prostate  normal and penis normal.  Genitourinary Comments: The prostate was difficult to palpate due to the size of the buttocks. There were no rectal masses felt. Prostate appears to be normal in size based on what I could feel. External genitalia were within normal limits with no hernias being palpable.  Musculoskeletal: Normal range of motion. He exhibits no edema.  Lymphadenopathy:    He has no cervical adenopathy.  Neurological: He is alert and oriented to person, place, and time. He has normal reflexes. No cranial nerve deficit.  Skin: Skin is warm and dry. No rash noted.  Psychiatric: He has a normal mood and affect. His behavior is normal. Judgment and thought content normal.  Nursing note and vitals reviewed.  BP (!) 155/93 (BP Location: Left Arm)   Pulse 69   Temp (!) 97.2 F (36.2 C) (Oral)   Ht 6' 3"  (1.905 m)   Wt (!) 343 lb (155.6 kg)   BMI 42.87 kg/m         Assessment & Plan:  1. Annual physical exam -The patient understands the importance of losing weight and this is his biggest health issue currently. - BMP8+EGFR; Future - CBC with Differential/Platelet; Future - Hepatic function panel; Future - VITAMIN D 25 Hydroxy (Vit-D Deficiency, Fractures); Future - Lipid panel; Future - Thyroid Panel With TSH; Future - PSA, total and free; Future - Urinalysis, Complete  2. Thyroid cancer ([place]) -Continue with thyroid replacement pending results of lab work - CBC with Differential/Platelet; Future - Thyroid Panel With TSH; Future  3. Gastroesophageal reflux disease, esophagitis presence not specified -Continue with Nexium and avoid foods that are more irritating - CBC with Differential/Platelet; Future  4. Vitamin D deficiency -Continue with current treatment pending results of lab work - CBC with Differential/Platelet; Future - VITAMIN D 25 Hydroxy (Vit-D Deficiency, Fractures); Future  5. Mixed hyperlipidemia -Continue with aggressive therapeutic lifestyle changes and  atorvastatin pending results of lab - CBC with Differential/Platelet; Future - Lipid panel; Future  6. Elevated blood pressure reading without diagnosis of hypertension -Continue to watch sodium intake and current treatment - BMP8+EGFR; Future - CBC with Differential/Platelet; Future - Hepatic function panel; Future  7. Morbid obesity ([place]) -The patient has gained 6 pounds since his last visit. He says he just went on vacation. He is trying but not trying hard enough to lose weight. He promises to do better.  Patient Instructions  Continue current medications. Continue good therapeutic lifestyle changes which include good diet and exercise. Fall precautions discussed with patient. If an FOBT was given today- please return it to our front desk.   **Flu shots are available--- please call and schedule a FLU-CLINIC appointment**  After your visit with [place] today you will receive a survey in the mail or online from [employer] regarding your care with [place]. Please take a moment to fill this out. Your feedback is very important to [place] as you can help [place] better understand your patient needs as well as improve your experience and satisfaction. WE CARE ABOUT YOU!!!   You should call and schedule a eye exam - [facility] associates  in [place] or Dr Duaine Dredge office  Return fasting for labs Continue to try and loose weight Drink plenty of water  Arrie Senate MD

## 2017-03-08 LAB — MICROSCOPIC EXAMINATION
Bacteria, UA: NONE SEEN
CASTS: NONE SEEN /LPF
Epithelial Cells (non renal): NONE SEEN /hpf (ref 0–10)

## 2017-03-08 LAB — URINALYSIS, COMPLETE
Bilirubin, UA: NEGATIVE
Glucose, UA: NEGATIVE
Ketones, UA: NEGATIVE
Leukocytes, UA: NEGATIVE
Nitrite, UA: NEGATIVE
Protein, UA: NEGATIVE
RBC, UA: NEGATIVE
Specific Gravity, UA: 1.023 (ref 1.005–1.030)
Urobilinogen, Ur: 0.2 mg/dL (ref 0.2–1.0)
pH, UA: 6 (ref 5.0–7.5)

## 2017-03-25 ENCOUNTER — Other Ambulatory Visit: Payer: BLUE CROSS/BLUE SHIELD

## 2017-03-25 DIAGNOSIS — E782 Mixed hyperlipidemia: Secondary | ICD-10-CM

## 2017-03-25 DIAGNOSIS — C73 Malignant neoplasm of thyroid gland: Secondary | ICD-10-CM

## 2017-03-25 DIAGNOSIS — R030 Elevated blood-pressure reading, without diagnosis of hypertension: Secondary | ICD-10-CM

## 2017-03-25 DIAGNOSIS — Z0000 Encounter for general adult medical examination without abnormal findings: Secondary | ICD-10-CM

## 2017-03-25 DIAGNOSIS — E559 Vitamin D deficiency, unspecified: Secondary | ICD-10-CM

## 2017-03-25 DIAGNOSIS — K219 Gastro-esophageal reflux disease without esophagitis: Secondary | ICD-10-CM

## 2017-03-26 LAB — BMP8+EGFR
BUN/Creatinine Ratio: 10 (ref 9–20)
BUN: 8 mg/dL (ref 6–20)
CHLORIDE: 98 mmol/L (ref 96–106)
CO2: 23 mmol/L (ref 20–29)
Calcium: 9.5 mg/dL (ref 8.7–10.2)
Creatinine, Ser: 0.77 mg/dL (ref 0.76–1.27)
GFR, EST AFRICAN AMERICAN: 132 mL/min/{1.73_m2} (ref >59)
GFR, EST NON AFRICAN AMERICAN: 114 mL/min/{1.73_m2} (ref >59)
Glucose: 80 mg/dL (ref 65–99)
POTASSIUM: 4.4 mmol/L (ref 3.5–5.2)
SODIUM: 138 mmol/L (ref 134–144)

## 2017-03-26 LAB — CBC WITH DIFFERENTIAL/PLATELET
Basophils Absolute: 0 10*3/uL (ref 0.0–0.2)
Basos: 1 %
EOS (ABSOLUTE): 0.3 10*3/uL (ref 0.0–0.4)
Eos: 4 %
HEMOGLOBIN: 15.4 g/dL (ref 13.0–17.7)
Hematocrit: 46.1 % (ref 37.5–51.0)
IMMATURE GRANULOCYTES: 0 %
Immature Grans (Abs): 0 10*3/uL (ref 0.0–0.1)
LYMPHS: 22 %
Lymphocytes Absolute: 1.8 10*3/uL (ref 0.7–3.1)
MCH: 27.6 pg (ref 26.6–33.0)
MCHC: 33.4 g/dL (ref 31.5–35.7)
MCV: 83 fL (ref 79–97)
MONOS ABS: 0.5 10*3/uL (ref 0.1–0.9)
Monocytes: 6 %
Neutrophils Absolute: 5.5 10*3/uL (ref 1.4–7.0)
Neutrophils: 67 %
Platelets: 302 10*3/uL (ref 150–379)
RBC: 5.58 x10E6/uL (ref 4.14–5.80)
RDW: 13.9 % (ref 12.3–15.4)
WBC: 8.1 10*3/uL (ref 3.4–10.8)

## 2017-03-26 LAB — HEPATIC FUNCTION PANEL
ALK PHOS: 101 IU/L (ref 39–117)
ALT: 79 IU/L — AB (ref 0–44)
AST: 44 IU/L — ABNORMAL HIGH (ref 0–40)
Albumin: 4.5 g/dL (ref 3.5–5.5)
BILIRUBIN TOTAL: 0.4 mg/dL (ref 0.0–1.2)
BILIRUBIN, DIRECT: 0.13 mg/dL (ref 0.00–0.40)
Total Protein: 6.9 g/dL (ref 6.0–8.5)

## 2017-03-26 LAB — PSA, TOTAL AND FREE
PSA, Free Pct: 18.6 %
PSA, Free: 0.13 ng/mL
Prostate Specific Ag, Serum: 0.7 ng/mL (ref 0.0–4.0)

## 2017-03-26 LAB — THYROID PANEL WITH TSH
FREE THYROXINE INDEX: 2.8 (ref 1.2–4.9)
T3 UPTAKE RATIO: 27 % (ref 24–39)
T4 TOTAL: 10.2 ug/dL (ref 4.5–12.0)
TSH: 1.8 u[IU]/mL (ref 0.450–4.500)

## 2017-03-26 LAB — LIPID PANEL
CHOL/HDL RATIO: 4.9 ratio (ref 0.0–5.0)
Cholesterol, Total: 180 mg/dL (ref 100–199)
HDL: 37 mg/dL — AB (ref >39)
LDL Calculated: 114 mg/dL — ABNORMAL HIGH (ref 0–99)
Triglycerides: 144 mg/dL (ref 0–149)
VLDL Cholesterol Cal: 29 mg/dL (ref 5–40)

## 2017-03-26 LAB — VITAMIN D 25 HYDROXY (VIT D DEFICIENCY, FRACTURES): Vit D, 25-Hydroxy: 33.2 ng/mL (ref 30.0–100.0)

## 2017-04-02 ENCOUNTER — Other Ambulatory Visit: Payer: Self-pay | Admitting: *Deleted

## 2017-04-02 DIAGNOSIS — R945 Abnormal results of liver function studies: Principal | ICD-10-CM

## 2017-04-02 DIAGNOSIS — R7989 Other specified abnormal findings of blood chemistry: Secondary | ICD-10-CM

## 2017-04-02 MED ORDER — VITAMIN D (ERGOCALCIFEROL) 1.25 MG (50000 UNIT) PO CAPS: 50000.0000 [IU] | ORAL_CAPSULE | ORAL | 3 refills | Status: DC

## 2017-04-03 ENCOUNTER — Other Ambulatory Visit: Payer: Self-pay | Admitting: *Deleted

## 2017-04-03 MED ORDER — LEVOTHYROXINE SODIUM 25 MCG PO TABS: ORAL_TABLET | ORAL | 1 refills | Status: DC

## 2017-04-11 ENCOUNTER — Ambulatory Visit (HOSPITAL_COMMUNITY): Admission: RE | Admit: 2017-04-11 | Payer: BLUE CROSS/BLUE SHIELD | Source: Ambulatory Visit

## 2017-04-16 ENCOUNTER — Other Ambulatory Visit: Payer: Self-pay | Admitting: Family Medicine

## 2017-04-16 DIAGNOSIS — R945 Abnormal results of liver function studies: Principal | ICD-10-CM

## 2017-04-16 DIAGNOSIS — R7989 Other specified abnormal findings of blood chemistry: Secondary | ICD-10-CM

## 2017-04-23 ENCOUNTER — Other Ambulatory Visit: Payer: BLUE CROSS/BLUE SHIELD

## 2017-04-29 ENCOUNTER — Ambulatory Visit
Admission: RE | Admit: 2017-04-29 | Discharge: 2017-04-29 | Disposition: A | Discharge status: Home / Self Care | Payer: BLUE CROSS/BLUE SHIELD | Source: Ambulatory Visit | Attending: Family Medicine | Admitting: Family Medicine

## 2017-04-29 DIAGNOSIS — R945 Abnormal results of liver function studies: Principal | ICD-10-CM

## 2017-04-29 DIAGNOSIS — R7989 Other specified abnormal findings of blood chemistry: Secondary | ICD-10-CM

## 2017-05-01 ENCOUNTER — Telehealth: Payer: Self-pay | Admitting: Family Medicine

## 2017-05-01 NOTE — Telephone Encounter (Signed)
Patient aware you are out of the office until tomorrow and you will review at that time

## 2017-05-03 NOTE — Telephone Encounter (Signed)
Pt notified of [place] results Will come back in for repeat labs

## 2017-05-03 NOTE — Telephone Encounter (Signed)
-----   Message from Chipper Herb, MD sent at 05/01/2017  3:58 PM EDT ----- As per radiology report------- the radiologist feels that the echogenicity in the liver is due to a fatty liver.  This would be consistent with the elevated liver function test.  The radiologist did note that the sensitivity of ultrasound in this situation is diminished.  My feeling is that we should just repeat the liver function test and about 4 weeks and if they are not down or they go higher we should go ahead and get a CT scan of the abdomen at that time.+++++++ He should continue to work on his diet closely and lose weight as this will help with any fatty liver issue.

## 2017-06-19 ENCOUNTER — Ambulatory Visit (INDEPENDENT_AMBULATORY_CARE_PROVIDER_SITE_OTHER): Payer: BLUE CROSS/BLUE SHIELD | Admitting: *Deleted

## 2017-06-19 DIAGNOSIS — Z23 Encounter for immunization: Secondary | ICD-10-CM | POA: Diagnosis not present

## 2017-07-22 ENCOUNTER — Other Ambulatory Visit: Payer: Self-pay | Admitting: *Deleted

## 2017-07-22 MED ORDER — ATORVASTATIN CALCIUM 20 MG PO TABS: 20.0000 mg | ORAL_TABLET | Freq: Every day | ORAL | 0 refills | Status: DC

## 2017-08-18 DIAGNOSIS — M791 Myalgia, unspecified site: Secondary | ICD-10-CM | POA: Diagnosis not present

## 2017-08-18 DIAGNOSIS — R509 Fever, unspecified: Secondary | ICD-10-CM | POA: Diagnosis not present

## 2017-08-18 DIAGNOSIS — R51 Headache: Secondary | ICD-10-CM | POA: Diagnosis not present

## 2017-09-05 ENCOUNTER — Ambulatory Visit (INDEPENDENT_AMBULATORY_CARE_PROVIDER_SITE_OTHER): Payer: BLUE CROSS/BLUE SHIELD | Admitting: Family Medicine

## 2017-09-05 ENCOUNTER — Encounter: Payer: Self-pay | Admitting: Family Medicine

## 2017-09-05 VITALS — BP 139/74 | HR 72 | Temp 97.0°F | Ht 75.0 in | Wt 346.0 lb

## 2017-09-05 DIAGNOSIS — C73 Malignant neoplasm of thyroid gland: Secondary | ICD-10-CM

## 2017-09-05 DIAGNOSIS — E782 Mixed hyperlipidemia: Secondary | ICD-10-CM

## 2017-09-05 DIAGNOSIS — E559 Vitamin D deficiency, unspecified: Secondary | ICD-10-CM | POA: Diagnosis not present

## 2017-09-05 DIAGNOSIS — K219 Gastro-esophageal reflux disease without esophagitis: Secondary | ICD-10-CM | POA: Diagnosis not present

## 2017-09-05 NOTE — Patient Instructions (Addendum)
Continue current medications. Continue good therapeutic lifestyle changes which include good diet and exercise. Fall precautions discussed with patient. If an FOBT was given today- please return it to our front desk. If you are over 40 years old - you may need Prevnar 22 or the adult Pneumonia vaccine.  **Flu shots are available--- please call and schedule a FLU-CLINIC appointment**  After your visit with [place] today you will receive a survey in the mail or online from [employer] regarding your care with [place]. Please take a moment to fill this out. Your feedback is very important to [place] as you can help [place] better understand your patient needs as well as improve your experience and satisfaction. WE CARE ABOUT YOU!!!   Keep follow-up appointment with endocrinology  continue with current medicines until lab work is returned Work with personal trainer Work aggressively on dietary intake If any kind of additives are needed always use something with Stevia truvia versus Splenda or sweet and low.

## 2017-09-05 NOTE — Progress Notes (Signed)
Subjective:    Patient ID: Jay Lang, male    DOB: Oct 29, 1975, 42 y.o.   MRN: [id number]  HPI Pt here for follow up and management of chronic medical problems which includes hyperlipidemia and gerd. He is taking medication regularly.  The patient is doing well overall and has no specific complaints.  He has morbid obesity and thyroid cancer.  He also has hyperlipidemia.  He has had some elevated blood sugars in the past.  His weight today is 346 and 3 pounds more than it was previously.  He will return to this office for fasting lab work.  Patient is very kind and pleasant as usual.  He denies any chest pain or shortness of breath anymore than usual.  As long as he is taking his Nexium he does well with his reflux issues.  He has no nausea vomiting diarrhea heartburn blood in the stool or black tarry bowel movements.  He is passing his water without problems.  He sees the endocrinologist sometime in April.  He is planning to start working with a Physiological scientist to work on weight loss with exercise regimen that is monitored.  Also can continues to watch his diet closely or plans to.    Patient Active Problem List   Diagnosis Date Noted  . Metabolic syndrome 83/38/2505  . Severe obesity (BMI >= 40) ([place]) 09/17/2014  . Dyslipidemia 07/30/2014  . Thyroid cancer, multifocal papillary carcinoma, T1a, N0, Mx 03/19/2011   Outpatient Encounter Medications as of 09/05/2017  Medication Sig  . atorvastatin (LIPITOR) 20 MG tablet Take 1 tablet (20 mg total) by mouth daily.  Marland Kitchen levothyroxine (SYNTHROID, LEVOTHROID) 125 MCG tablet Take 250 mcg by mouth daily.  Marland Kitchen NEXIUM 40 MG capsule TAKE ONE CAPSULE BY MOUTH EVERY DAY  . Vitamin D, Ergocalciferol, (DRISDOL) 50000 units CAPS capsule Take 1 capsule (50,000 Units total) by mouth every 7 (seven) days.  . [DISCONTINUED] levothyroxine (SYNTHROID, LEVOTHROID) 25 MCG tablet Take 1/2 tab on Mon, Wed and Fri. (take this with your regular dose)   No  facility-administered encounter medications on file as of 09/05/2017.       Review of Systems  Constitutional: Negative.   HENT: Negative.   Eyes: Negative.   Respiratory: Negative.   Cardiovascular: Negative.   Gastrointestinal: Negative.   Endocrine: Negative.   Genitourinary: Negative.   Musculoskeletal: Negative.   Skin: Negative.   Allergic/Immunologic: Negative.   Neurological: Negative.   Hematological: Negative.   Psychiatric/Behavioral: Negative.        Objective:   Physical Exam  Constitutional: He is oriented to person, place, and time. He appears well-developed and well-nourished. No distress.  Pleasant and alert  HENT:  Head: Normocephalic and atraumatic.  Right Ear: External ear normal.  Left Ear: External ear normal.  Mouth/Throat: Oropharynx is clear and moist. No oropharyngeal exudate.  Nasal septal irritation  Eyes: Conjunctivae and EOM are normal. Pupils are equal, round, and reactive to light. Right eye exhibits no discharge. Left eye exhibits no discharge. No scleral icterus.  Neck: Normal range of motion. Neck supple. No thyromegaly present.  No bruits thyromegaly noted or adenopathy  Cardiovascular: Normal rate, regular rhythm, normal heart sounds and intact distal pulses.  No murmur heard. 72/min with a regular rate and rhythm  Pulmonary/Chest: Effort normal and breath sounds normal. No respiratory distress. He has no wheezes. He has no rales. He exhibits no tenderness.  Clear anteriorly and posteriorly  Abdominal: Soft. Bowel sounds are normal. He exhibits no  mass. There is no tenderness. There is no rebound and no guarding.  Obesity without masses tenderness or organ enlargement or bruits  Musculoskeletal: Normal range of motion. He exhibits no edema.  Lymphadenopathy:    He has no cervical adenopathy.  Neurological: He is alert and oriented to person, place, and time. He has normal reflexes. No cranial nerve deficit.  Skin: Skin is warm and dry.  No rash noted.  Rash on feet was cleared with fluconazole.  This was given to him by the dentist to take care of the mouth problem.  Psychiatric: He has a normal mood and affect. His behavior is normal. Judgment and thought content normal.  Nursing note and vitals reviewed.  BP (!) 143/82 (BP Location: Left Arm)   Pulse 72   Temp (!) 97 F (36.1 C) (Oral)   Ht _0  (1.905 m)   Wt (!) 346 lb (156.9 kg)   BMI 43.25 kg/m   Repeat blood pressure was below 140.      Assessment & Plan:  1. Gastroesophageal reflux disease, esophagitis presence not specified -Patient does well with this as long as he is taking Nexium. - CBC with Differential/Platelet; Future - Hepatic function panel; Future  2. Vitamin D deficiency -Continue current treatment pending results of lab work - CBC with Differential/Platelet; Future - VITAMIN D 25 Hydroxy (Vit-D Deficiency, Fractures); Future  3. Thyroid cancer [facility]) -Follow-up with endocrinology as planned - CBC with Differential/Platelet; Future - BMP8+EGFR; Future - Thyroid Panel With TSH; Future  4. Mixed hyperlipidemia -Continue atorvastatin and aggressive therapeutic lifestyle changes pending results of lab work - CBC with Differential/Platelet; Future - BMP8+EGFR; Future - Lipid panel; Future  5. Morbid obesity ([place]) -Follow through with personal trainer and work closely with diet - CBC with Differential/Platelet; Future - BMP8+EGFR; Future  Patient Instructions  Continue current medications. Continue good therapeutic lifestyle changes which include good diet and exercise. Fall precautions discussed with patient. If an FOBT was given today- please return it to our front desk. If you are over 95 years old - you may need Prevnar 11 or the adult Pneumonia vaccine.  **Flu shots are available--- please call and schedule a FLU-CLINIC appointment**  After your visit with [place] today you will receive a survey in the mail or online from [employer] regarding your care with [place]. Please take a moment to fill this out. Your feedback is very important to [place] as you can help [place] better understand your patient needs as well as improve your experience and satisfaction. WE CARE ABOUT YOU!!!   Keep follow-up appointment with endocrinology  continue with current medicines until lab work is returned Work with personal trainer Work aggressively on dietary intake If any kind of additives are needed always use something with Stevia truvia versus Splenda or sweet and low.  Arrie Senate MD

## 2017-10-16 DIAGNOSIS — E890 Postprocedural hypothyroidism: Secondary | ICD-10-CM | POA: Diagnosis not present

## 2017-10-16 DIAGNOSIS — C73 Malignant neoplasm of thyroid gland: Secondary | ICD-10-CM | POA: Diagnosis not present

## 2017-10-16 DIAGNOSIS — E7800 Pure hypercholesterolemia, unspecified: Secondary | ICD-10-CM | POA: Diagnosis not present

## 2017-10-16 DIAGNOSIS — R7301 Impaired fasting glucose: Secondary | ICD-10-CM | POA: Diagnosis not present

## 2017-10-18 ENCOUNTER — Other Ambulatory Visit: Payer: Self-pay | Admitting: Family Medicine

## 2017-10-21 ENCOUNTER — Other Ambulatory Visit: Payer: BLUE CROSS/BLUE SHIELD

## 2017-10-21 DIAGNOSIS — E559 Vitamin D deficiency, unspecified: Secondary | ICD-10-CM | POA: Diagnosis not present

## 2017-10-21 DIAGNOSIS — K219 Gastro-esophageal reflux disease without esophagitis: Secondary | ICD-10-CM | POA: Diagnosis not present

## 2017-10-21 DIAGNOSIS — C73 Malignant neoplasm of thyroid gland: Secondary | ICD-10-CM

## 2017-10-21 DIAGNOSIS — Z1211 Encounter for screening for malignant neoplasm of colon: Secondary | ICD-10-CM | POA: Diagnosis not present

## 2017-10-21 DIAGNOSIS — E782 Mixed hyperlipidemia: Secondary | ICD-10-CM | POA: Diagnosis not present

## 2017-10-22 LAB — LIPID PANEL
Chol/HDL Ratio: 5.4 ratio — ABNORMAL HIGH (ref 0.0–5.0)
Cholesterol, Total: 209 mg/dL — ABNORMAL HIGH (ref 100–199)
HDL: 39 mg/dL — ABNORMAL LOW (ref >39)
LDL Calculated: 131 mg/dL — ABNORMAL HIGH (ref 0–99)
Triglycerides: 196 mg/dL — ABNORMAL HIGH (ref 0–149)
VLDL Cholesterol Cal: 39 mg/dL (ref 5–40)

## 2017-10-22 LAB — CBC WITH DIFFERENTIAL/PLATELET
BASOS: 0 %
Basophils Absolute: 0 10*3/uL (ref 0.0–0.2)
EOS (ABSOLUTE): 0.4 10*3/uL (ref 0.0–0.4)
Eos: 5 %
Hematocrit: 44 % (ref 37.5–51.0)
Hemoglobin: 15.1 g/dL (ref 13.0–17.7)
Immature Grans (Abs): 0 10*3/uL (ref 0.0–0.1)
Immature Granulocytes: 1 %
Lymphocytes Absolute: 1.5 10*3/uL (ref 0.7–3.1)
Lymphs: 23 %
MCH: 27.8 pg (ref 26.6–33.0)
MCHC: 34.3 g/dL (ref 31.5–35.7)
MCV: 81 fL (ref 79–97)
MONOS ABS: 0.4 10*3/uL (ref 0.1–0.9)
Monocytes: 6 %
NEUTROS ABS: 4.3 10*3/uL (ref 1.4–7.0)
Neutrophils: 65 %
Platelets: 313 10*3/uL (ref 150–379)
RBC: 5.44 x10E6/uL (ref 4.14–5.80)
RDW: 14.5 % (ref 12.3–15.4)
WBC: 6.6 10*3/uL (ref 3.4–10.8)

## 2017-10-22 LAB — HEPATIC FUNCTION PANEL
ALT: 72 IU/L — AB (ref 0–44)
AST: 46 IU/L — ABNORMAL HIGH (ref 0–40)
Albumin: 4.5 g/dL (ref 3.5–5.5)
Alkaline Phosphatase: 104 IU/L (ref 39–117)
BILIRUBIN TOTAL: 0.4 mg/dL (ref 0.0–1.2)
BILIRUBIN, DIRECT: 0.11 mg/dL (ref 0.00–0.40)
Total Protein: 6.9 g/dL (ref 6.0–8.5)

## 2017-10-22 LAB — THYROID PANEL WITH TSH
Free Thyroxine Index: 2.3 (ref 1.2–4.9)
T3 UPTAKE RATIO: 26 % (ref 24–39)
T4, Total: 8.8 ug/dL (ref 4.5–12.0)
TSH: 6.92 u[IU]/mL — AB (ref 0.450–4.500)

## 2017-10-22 LAB — BMP8+EGFR
BUN/Creatinine Ratio: 12 (ref 9–20)
BUN: 9 mg/dL (ref 6–24)
CO2: 26 mmol/L (ref 20–29)
Calcium: 9.4 mg/dL (ref 8.7–10.2)
Chloride: 102 mmol/L (ref 96–106)
Creatinine, Ser: 0.75 mg/dL — ABNORMAL LOW (ref 0.76–1.27)
GFR calc non Af Amer: 115 mL/min/{1.73_m2} (ref >59)
GFR, EST AFRICAN AMERICAN: 133 mL/min/{1.73_m2} (ref >59)
Glucose: 89 mg/dL (ref 65–99)
POTASSIUM: 4.5 mmol/L (ref 3.5–5.2)
SODIUM: 141 mmol/L (ref 134–144)

## 2017-10-22 LAB — VITAMIN D 25 HYDROXY (VIT D DEFICIENCY, FRACTURES): Vit D, 25-Hydroxy: 30.9 ng/mL (ref 30.0–100.0)

## 2017-10-23 DIAGNOSIS — E890 Postprocedural hypothyroidism: Secondary | ICD-10-CM | POA: Diagnosis not present

## 2017-10-23 DIAGNOSIS — R7301 Impaired fasting glucose: Secondary | ICD-10-CM | POA: Diagnosis not present

## 2017-10-23 DIAGNOSIS — E7800 Pure hypercholesterolemia, unspecified: Secondary | ICD-10-CM | POA: Diagnosis not present

## 2017-10-23 DIAGNOSIS — C73 Malignant neoplasm of thyroid gland: Secondary | ICD-10-CM | POA: Diagnosis not present

## 2017-10-23 LAB — FECAL OCCULT BLOOD, IMMUNOCHEMICAL: Fecal Occult Bld: NEGATIVE

## 2017-12-10 DIAGNOSIS — K200 Eosinophilic esophagitis: Secondary | ICD-10-CM | POA: Diagnosis not present

## 2017-12-10 DIAGNOSIS — K219 Gastro-esophageal reflux disease without esophagitis: Secondary | ICD-10-CM | POA: Diagnosis not present

## 2017-12-20 DIAGNOSIS — E890 Postprocedural hypothyroidism: Secondary | ICD-10-CM | POA: Diagnosis not present

## 2018-01-27 ENCOUNTER — Encounter: Payer: Self-pay | Admitting: *Deleted

## 2018-01-27 ENCOUNTER — Ambulatory Visit: Payer: BLUE CROSS/BLUE SHIELD | Admitting: *Deleted

## 2018-01-27 VITALS — BP 135/76 | HR 66 | Wt 350.0 lb

## 2018-01-27 DIAGNOSIS — Z0130 Encounter for examination of blood pressure without abnormal findings: Secondary | ICD-10-CM

## 2018-01-27 NOTE — Progress Notes (Signed)
Unfortunately, this patient continues to gain weight.  See if we can set him up with a nutritionist with [place] if he would be willing to go.  We need to try to get some of the weight off.

## 2018-01-27 NOTE — Progress Notes (Signed)
Pt here for BP and wt check bp  135 76 P 66  Wt  350lb

## 2018-02-19 NOTE — Progress Notes (Signed)
Will discuss at appt 03/11/2018

## 2018-03-11 ENCOUNTER — Ambulatory Visit (INDEPENDENT_AMBULATORY_CARE_PROVIDER_SITE_OTHER): Payer: BLUE CROSS/BLUE SHIELD | Admitting: Family Medicine

## 2018-03-11 ENCOUNTER — Encounter: Payer: Self-pay | Admitting: Family Medicine

## 2018-03-11 VITALS — BP 146/79 | HR 71 | Temp 97.4°F | Ht 75.0 in | Wt 349.0 lb

## 2018-03-11 DIAGNOSIS — E559 Vitamin D deficiency, unspecified: Secondary | ICD-10-CM

## 2018-03-11 DIAGNOSIS — C73 Malignant neoplasm of thyroid gland: Secondary | ICD-10-CM | POA: Diagnosis not present

## 2018-03-11 DIAGNOSIS — K219 Gastro-esophageal reflux disease without esophagitis: Secondary | ICD-10-CM

## 2018-03-11 DIAGNOSIS — E782 Mixed hyperlipidemia: Secondary | ICD-10-CM

## 2018-03-11 NOTE — Progress Notes (Signed)
Subjective:    Patient ID: Jay Lang, male    DOB: [birth year], 42 y.o.   MRN: [id number]  HPI Pt here for follow up and management of chronic medical problems which includes hyperlipidemia and gerd. He is taking medication regularly.  The patient continues to have issues with weight and diet.  He will return to the office for nonfasting lab work.  His initial blood pressure today were elevated systolic at 334 and 356 on 2 different occasions.  The patient is pleasant and kind.  It is just hard for him to get motivated to lose weight.  He works between [employer] in [place].  He frequently stops at [employer] and drinks a lot of [employer].  We discussed in length all of the side effects from being morbidly obese.  He seems to understand this but just cannot get jumpstarted to start losing the weight.  We discussed the Nutrisystem weight plan that he actually brought up.  We discussed weight management programs we discussed bariatric surgery.  We did find out that [facility] has a weight management program on [address] in [place] and we will give him the number for him to call and set up an appointment to go there to be evaluated and see if they can help him with losing weight.  The patient today denies any chest pain pressure tightness or shortness of breath other than shortness of breath with climbing hills.  He denies any trouble with nausea or vomiting swallowing heartburn indigestion blood in the stool change in bowel habits or black tarry bowel movements.  He is passing his water without problems although one episode where it was slow one time.   Patient Active Problem List   Diagnosis Date Noted  . Metabolic syndrome 86/16/8372  . Severe obesity (BMI >= 40) ([place]) 09/17/2014  . Dyslipidemia 07/30/2014  . Thyroid cancer, multifocal papillary carcinoma, T1a, N0, Mx 03/19/2011   Outpatient Encounter Medications as of 03/11/2018  Medication Sig  . atorvastatin (LIPITOR)  20 MG tablet TAKE 1 TABLET BY MOUTH EVERY DAY  . levothyroxine (SYNTHROID, LEVOTHROID) 137 MCG tablet Take 2 tablets by mouth daily.  Marland Kitchen NEXIUM 40 MG capsule TAKE ONE CAPSULE BY MOUTH EVERY DAY  . Vitamin D, Ergocalciferol, (DRISDOL) 50000 units CAPS capsule Take 1 capsule (50,000 Units total) by mouth every 7 (seven) days.  . [DISCONTINUED] levothyroxine (SYNTHROID, LEVOTHROID) 125 MCG tablet Take 250 mcg by mouth daily.   No facility-administered encounter medications on file as of 03/11/2018.       Review of Systems  Constitutional: Negative.   HENT: Negative.   Eyes: Negative.   Respiratory: Negative.   Cardiovascular: Negative.   Gastrointestinal: Negative.   Endocrine: Negative.   Genitourinary: Negative.   Musculoskeletal: Negative.   Skin: Negative.   Allergic/Immunologic: Negative.   Neurological: Negative.   Hematological: Negative.   Psychiatric/Behavioral: Negative.        Objective:   Physical Exam  Constitutional: He is oriented to person, place, and time. He appears well-developed and well-nourished. No distress.  The patient is pleasant and alert and very kind and sincere.  HENT:  Head: Normocephalic and atraumatic.  Right Ear: External ear normal.  Left Ear: External ear normal.  Nose: Nose normal.  Mouth/Throat: Oropharynx is clear and moist. No oropharyngeal exudate.  Eyes: Pupils are equal, round, and reactive to light. Conjunctivae and EOM are normal. Right eye exhibits no discharge. Left eye exhibits no discharge. No scleral icterus.  Neck: Normal range of motion. Neck supple. No thyromegaly present.  Cardiovascular: Normal rate, regular rhythm and normal heart sounds.  No murmur heard. Heart is regular at 72/min  Pulmonary/Chest: Effort normal and breath sounds normal. He has no wheezes. He has no rales.  Clear anteriorly and posteriorly no axillary adenopathy  Abdominal: Soft. Bowel sounds are normal. He exhibits no mass. There is no tenderness.    Morbid obesity without liver or spleen enlargement epigastric tenderness masses bruits or inguinal adenopathy.  Musculoskeletal: Normal range of motion. He exhibits no edema.  Lymphadenopathy:    He has no cervical adenopathy.  Neurological: He is alert and oriented to person, place, and time. He has normal reflexes. No cranial nerve deficit.  Skin: Skin is warm and dry. No rash noted.  Psychiatric: He has a normal mood and affect. His behavior is normal. Judgment and thought content normal.  The patient's mood affect and behavior are all normal but unfortunately not committed to reducing this weight.  Nursing note and vitals reviewed.  BP (!) 146/79   Pulse 71   Temp (!) 97.4 F (36.3 C) (Oral)   Ht 6' 3"  (1.905 m)   Wt (!) 349 lb (158.3 kg)   BMI 43.62 kg/m         Assessment & Plan:  1. Gastroesophageal reflux disease, esophagitis presence not specified -Continue with Nexium and work at reducing weight - CBC with Differential/Platelet; Future - Hepatic function panel; Future  2. Vitamin D deficiency -Continue with vitamin D replacement pending results of lab work - CBC with Differential/Platelet; Future - VITAMIN D 25 Hydroxy (Vit-D Deficiency, Fractures); Future  3. Thyroid cancer ([place]) -Follow-up regularly with endocrinologist - CBC with Differential/Platelet; Future  4. Mixed hyperlipidemia -Continue with atorvastatin and aggressive therapeutic lifestyle changes including diet and exercise geared to losing weight. - BMP8+EGFR; Future - CBC with Differential/Platelet; Future - Lipid panel; Future  5. Morbid obesity ([place]) -Patient was given number for [facility] in [place] and their weight management program.  He will call this number if he has trouble getting in or getting an appointment he should get back in touch with [place] and we will make arrangements for him.  I have not had a lot of personal success with motivating him to lose weight  through the years. -Reemphasized drinking more water eating less bread avoiding any sugar additives to anything that he is eating or drinking.  Patient Instructions  Continue current medications. Continue good therapeutic lifestyle changes which include good diet and exercise. Fall precautions discussed with patient. If an FOBT was given today- please return it to our front desk. If you are over 54 years old - you may need Prevnar 70 or the adult Pneumonia vaccine.  **Flu shots are available--- please call and schedule a FLU-CLINIC appointment**  After your visit with [place] today you will receive a survey in the mail or online from [employer] regarding your care with [place]. Please take a moment to fill this out. Your feedback is very important to [place] as you can help [place] better understand your patient needs as well as improve your experience and satisfaction. WE CARE ABOUT YOU!!!   Patient will call the weight management system with [facility] in [place] off of [address]. [facility]. He understands that if he needs a referral he will call us back and we will give him that referral. In the meantime he should make every effort to walk  and exercise as much as possible drink more water drink less drinks eat less bread and potatoes and certainly do not add any sugar to anything that he is eating or drinking.  We mentioned that Stevia is a good alternative to unsweetened tea. He brought up the Nutrisystem and we suggested trying this until he can get in to see the people at [facility] in [place].  Arrie Senate MD

## 2018-03-11 NOTE — Patient Instructions (Addendum)
Continue current medications. Continue good therapeutic lifestyle changes which include good diet and exercise. Fall precautions discussed with patient. If an FOBT was given today- please return it to our front desk. If you are over 40 years old - you may need Prevnar 63 or the adult Pneumonia vaccine.  **Flu shots are available--- please call and schedule a FLU-CLINIC appointment**  After your visit with [place] today you will receive a survey in the mail or online from [employer] regarding your care with [place]. Please take a moment to fill this out. Your feedback is very important to [place] as you can help [place] better understand your patient needs as well as improve your experience and satisfaction. WE CARE ABOUT YOU!!!   Patient will call the weight management system with [facility] in [place] off of [address]. [facility] He understands that if he needs a referral he will call us back and we will give him that referral. In the meantime he should make every effort to walk and exercise as much as possible drink more water drink less drinks eat less bread and potatoes and certainly do not add any sugar to anything that he is eating or drinking.  We mentioned that Stevia is a good alternative to unsweetened tea. He brought up the Nutrisystem and we suggested trying this until he can get in to see the people at [facility] in [place].

## 2018-03-18 ENCOUNTER — Other Ambulatory Visit: Payer: Self-pay | Admitting: Family Medicine

## 2018-03-19 NOTE — Telephone Encounter (Signed)
Last Vit D 10/21/17  30.9

## 2018-04-12 ENCOUNTER — Other Ambulatory Visit: Payer: Self-pay | Admitting: Family Medicine

## 2018-04-17 ENCOUNTER — Other Ambulatory Visit: Payer: Self-pay | Admitting: Family Medicine

## 2018-05-14 ENCOUNTER — Other Ambulatory Visit: Payer: Self-pay | Admitting: Family Medicine

## 2018-06-13 ENCOUNTER — Other Ambulatory Visit: Payer: Self-pay | Admitting: Family Medicine

## 2018-06-17 ENCOUNTER — Ambulatory Visit (INDEPENDENT_AMBULATORY_CARE_PROVIDER_SITE_OTHER): Payer: BLUE CROSS/BLUE SHIELD | Admitting: Family Medicine

## 2018-06-17 ENCOUNTER — Encounter: Payer: Self-pay | Admitting: Family Medicine

## 2018-06-17 VITALS — BP 134/80 | HR 78 | Temp 97.9°F | Ht 75.0 in | Wt 349.0 lb

## 2018-06-17 DIAGNOSIS — Z0001 Encounter for general adult medical examination with abnormal findings: Secondary | ICD-10-CM | POA: Diagnosis not present

## 2018-06-17 DIAGNOSIS — K219 Gastro-esophageal reflux disease without esophagitis: Secondary | ICD-10-CM | POA: Diagnosis not present

## 2018-06-17 DIAGNOSIS — R030 Elevated blood-pressure reading, without diagnosis of hypertension: Secondary | ICD-10-CM | POA: Diagnosis not present

## 2018-06-17 DIAGNOSIS — E782 Mixed hyperlipidemia: Secondary | ICD-10-CM | POA: Diagnosis not present

## 2018-06-17 DIAGNOSIS — Z0000 Encounter for general adult medical examination without abnormal findings: Secondary | ICD-10-CM | POA: Diagnosis not present

## 2018-06-17 DIAGNOSIS — Z135 Encounter for screening for eye and ear disorders: Secondary | ICD-10-CM

## 2018-06-17 DIAGNOSIS — E559 Vitamin D deficiency, unspecified: Secondary | ICD-10-CM | POA: Diagnosis not present

## 2018-06-17 DIAGNOSIS — C73 Malignant neoplasm of thyroid gland: Secondary | ICD-10-CM

## 2018-06-17 NOTE — Progress Notes (Signed)
Subjective:    Patient ID: Jay Lang, male    DOB: 1976-05-24, 42 y.o.   MRN: [id number]  HPI Patient is here today for annual wellness exam and follow up of chronic medical problems which includes hyperlipidemia. He is taking medication regularly.  The patient comes in today for his regular physical exam.  He will get an EKG today rectal exam today lab work today and a urinalysis.  His weight remains the same at 349 he continues to have morbid obesity.  His blood pressure is good.  He has no complaints as usual and does not need refills on any of his medicines.  He is currently taking thyroid medicine regularly atorvastatin Nexium and vitamin D 50,000 units weekly.  Patient today is a new father.  Just had a son about 3 months ago.  He says he was in the mode of losing weight but then had a new baby and he has put back on his weight again.  He is excited about the baby but fatigued with this.  He denies any chest pain pressure tightness or shortness of breath anymore than usual.  As long as he is taking his Nexium he is not having any heartburn or indigestion.  He says his bowel movements are regularly without blood in the stool or black tarry bowel movements.  No abdominal pain.  He is passing his water without problems.     Patient Active Problem List   Diagnosis Date Noted  . Metabolic syndrome 86/38/1771  . Severe obesity (BMI >= 40) ([place]) 09/17/2014  . Dyslipidemia 07/30/2014  . Thyroid cancer, multifocal papillary carcinoma, T1a, N0, Mx 03/19/2011   Outpatient Encounter Medications as of 06/17/2018  Medication Sig  . atorvastatin (LIPITOR) 20 MG tablet TAKE 1 TABLET BY MOUTH EVERY DAY  . levothyroxine (SYNTHROID, LEVOTHROID) 137 MCG tablet Take 2 tablets by mouth daily.  Marland Kitchen NEXIUM 40 MG capsule TAKE ONE CAPSULE BY MOUTH EVERY DAY  . Vitamin D, Ergocalciferol, (DRISDOL) 1.25 MG (50000 UT) CAPS capsule TAKE 1 CAPSULE (50,000 UNITS TOTAL) BY MOUTH EVERY 7 (SEVEN) DAYS.   No  facility-administered encounter medications on file as of 06/17/2018.      Review of Systems  Constitutional: Negative.   HENT: Negative.   Eyes: Negative.   Respiratory: Negative.   Cardiovascular: Negative.   Gastrointestinal: Negative.   Endocrine: Negative.   Genitourinary: Negative.   Musculoskeletal: Negative.   Skin: Negative.   Allergic/Immunologic: Negative.   Neurological: Negative.   Hematological: Negative.   Psychiatric/Behavioral: Negative.        Objective:   Physical Exam Vitals signs and nursing note reviewed.  Constitutional:      Appearance: Normal appearance. He is well-developed. He is obese. He is not ill-appearing.     Comments: The patient is kind but just does not get the weight off and keep it off.  HENT:     Head: Normocephalic and atraumatic.     Right Ear: Tympanic membrane, ear canal and external ear normal. There is no impacted cerumen.     Left Ear: Tympanic membrane, ear canal and external ear normal. There is no impacted cerumen.     Nose: Nose normal.     Mouth/Throat:     Mouth: Mucous membranes are moist.     Pharynx: Oropharynx is clear. No oropharyngeal exudate.  Eyes:     General: No scleral icterus.       Right eye: No discharge.  Left eye: No discharge.     Extraocular Movements: Extraocular movements intact.     Conjunctiva/sclera: Conjunctivae normal.     Pupils: Pupils are equal, round, and reactive to light.     Comments: Patient will be scheduled for eye exam in [place].  Neck:     Musculoskeletal: Normal range of motion and neck supple.     Thyroid: No thyromegaly.     Vascular: No carotid bruit.     Trachea: No tracheal deviation.     Comments: No thyromegaly or anterior cervical adenopathy Cardiovascular:     Rate and Rhythm: Normal rate and regular rhythm.     Pulses: Normal pulses.     Heart sounds: Normal heart sounds. No murmur.     Comments: The heart is regular at 72/min with good pedal pulses and no  edema Pulmonary:     Effort: Pulmonary effort is normal. No respiratory distress.     Breath sounds: Normal breath sounds. No wheezing or rales.     Comments: Clear anteriorly and posteriorly and no axillary adenopathy Abdominal:     General: Bowel sounds are normal.     Palpations: Abdomen is soft. There is no mass.     Tenderness: There is no abdominal tenderness.     Comments: Abdomen is morbidly obese without masses or organ enlargement or bruits  Genitourinary:    Penis: Normal.      Scrotum/Testes: Normal.     Rectum: Normal.     Comments: The external genitalia were normal.  There is no sign of any inguinal hernias or testicular masses.  The rectal exam was difficult due to buttock size and short finger from my side.  No rectal masses were palpated and prostate exam was barely palpable and no masses were felt.  I encouraged the patient he needs to continue to get his exams yearly. Musculoskeletal: Normal range of motion.        General: No tenderness or deformity.     Right lower leg: No edema.     Left lower leg: No edema.  Lymphadenopathy:     Cervical: No cervical adenopathy.  Skin:    General: Skin is warm and dry.     Findings: No rash.  Neurological:     General: No focal deficit present.     Mental Status: He is alert and oriented to person, place, and time. Mental status is at baseline.     Cranial Nerves: No cranial nerve deficit.     Deep Tendon Reflexes: Reflexes are normal and symmetric. Reflexes normal.  Psychiatric:        Mood and Affect: Mood normal.        Behavior: Behavior normal.        Thought Content: Thought content normal.        Judgment: Judgment normal.    BP 134/80 (BP Location: Left Arm)   Pulse 78   Temp 97.9 F (36.6 C) (Oral)   Ht _0  (1.905 m)   Wt (!) 349 lb (158.3 kg)   BMI 43.62 kg/m    EKG with results pending===     Assessment & Plan:  1. Annual physical exam -Patient will continue to make every effort to lose weight  through diet and exercise - BMP8+EGFR - CBC with Differential/Platelet - Lipid panel - VITAMIN D 25 Hydroxy (Vit-D Deficiency, Fractures) - Hepatic function panel - PSA, total and free - Urinalysis, Complete - EKG 12-Lead  2. Mixed hyperlipidemia -Continue current treatment  along with aggressive therapeutic lifestyle changes - BMP8+EGFR - CBC with Differential/Platelet - Lipid panel - Hepatic function panel - EKG 12-Lead  3. Vitamin D deficiency -Continue with vitamin D replacement pending results of lab work - CBC with Differential/Platelet - VITAMIN D 25 Hydroxy (Vit-D Deficiency, Fractures)  4. Gastroesophageal reflux disease, esophagitis presence not specified -Continue with Nexium - CBC with Differential/Platelet - Hepatic function panel  5. Thyroid cancer ([place]) -Follow-up with Dr. Chalmers Cater as planned - CBC with Differential/Platelet - Ambulatory referral to Ophthalmology  6. Elevated blood pressure reading without diagnosis of hypertension -Blood pressure was good today.  Patient will continue to watch sodium intake closely and monitor blood pressure periodically at home and make all efforts to lose weight - BMP8+EGFR - CBC with Differential/Platelet - EKG 12-Lead - Ambulatory referral to Ophthalmology  7. Screening for eye condition - Ambulatory referral to Ophthalmology  8. Morbid obesity ([place]) -Continue to work aggressively on weight loss through diet and exercise.  If patient decides he wants to go see a dietary counselor associated with the hospital we will be glad to work with him and set up that appointment all he has to do is let us know this.  Patient Instructions  Continue current medications. Continue good therapeutic lifestyle changes which include good diet and exercise. Fall precautions discussed with patient. If an FOBT was given today- please return it to our front desk.   **Flu shots are available--- please call and schedule a FLU-CLINIC  appointment**  After your visit with [place] today you will receive a survey in the mail or online from [employer] regarding your care with [place]. Please take a moment to fill this out. Your feedback is very important to [place] as you can help [place] better understand your patient needs as well as improve your experience and satisfaction. WE CARE ABOUT YOU!!!   The patient should continue to make every effort to lose weight through diet and exercise for himself for his family and for longevity purposes. He should drink more water and less soda drinks He should avoid all carbs as much as possible If he needs assistance or he is willing to go talk to a dietary specialist we will be glad at any time to make a referral so that he can get some more help with this. We will make arrangements for him to see Dr. Carolynn Sayers for a good eye exam. Continue to follow-up with Dr. Chalmers Cater  Arrie Senate MD

## 2018-06-17 NOTE — Patient Instructions (Addendum)
Continue current medications. Continue good therapeutic lifestyle changes which include good diet and exercise. Fall precautions discussed with patient. If an FOBT was given today- please return it to our front desk.   **Flu shots are available--- please call and schedule a FLU-CLINIC appointment**  After your visit with [place] today you will receive a survey in the mail or online from [employer] regarding your care with [place]. Please take a moment to fill this out. Your feedback is very important to [place] as you can help [place] better understand your patient needs as well as improve your experience and satisfaction. WE CARE ABOUT YOU!!!   The patient should continue to make every effort to lose weight through diet and exercise for himself for his family and for longevity purposes. He should drink more water and less soda drinks He should avoid all carbs as much as possible If he needs assistance or he is willing to go talk to a dietary specialist we will be glad at any time to make a referral so that he can get some more help with this. We will make arrangements for him to see Dr. Carolynn Sayers for a good eye exam. Continue to follow-up with Dr. Chalmers Cater

## 2018-06-18 LAB — PSA, TOTAL AND FREE
PROSTATE SPECIFIC AG, SERUM: 0.8 ng/mL (ref 0.0–4.0)
PSA FREE PCT: 15 %
PSA FREE: 0.12 ng/mL

## 2018-06-18 LAB — BMP8+EGFR
BUN/Creatinine Ratio: 11 (ref 9–20)
BUN: 8 mg/dL (ref 6–24)
CO2: 23 mmol/L (ref 20–29)
CREATININE: 0.72 mg/dL — AB (ref 0.76–1.27)
Calcium: 9.5 mg/dL (ref 8.7–10.2)
Chloride: 98 mmol/L (ref 96–106)
GFR, EST AFRICAN AMERICAN: 135 mL/min/{1.73_m2} (ref >59)
GFR, EST NON AFRICAN AMERICAN: 117 mL/min/{1.73_m2} (ref >59)
Glucose: 74 mg/dL (ref 65–99)
POTASSIUM: 4.3 mmol/L (ref 3.5–5.2)
Sodium: 137 mmol/L (ref 134–144)

## 2018-06-18 LAB — CBC WITH DIFFERENTIAL/PLATELET
BASOS: 1 %
Basophils Absolute: 0.1 10*3/uL (ref 0.0–0.2)
EOS (ABSOLUTE): 0.4 10*3/uL (ref 0.0–0.4)
EOS: 5 %
HEMATOCRIT: 47.7 % (ref 37.5–51.0)
Hemoglobin: 15.5 g/dL (ref 13.0–17.7)
IMMATURE GRANS (ABS): 0 10*3/uL (ref 0.0–0.1)
IMMATURE GRANULOCYTES: 1 %
Lymphocytes Absolute: 1.8 10*3/uL (ref 0.7–3.1)
Lymphs: 22 %
MCH: 27.1 pg (ref 26.6–33.0)
MCHC: 32.5 g/dL (ref 31.5–35.7)
MCV: 84 fL (ref 79–97)
Monocytes Absolute: 0.6 10*3/uL (ref 0.1–0.9)
Monocytes: 7 %
NEUTROS PCT: 64 %
Neutrophils Absolute: 5.6 10*3/uL (ref 1.4–7.0)
PLATELETS: 350 10*3/uL (ref 150–450)
RBC: 5.71 x10E6/uL (ref 4.14–5.80)
RDW: 13.2 % (ref 12.3–15.4)
WBC: 8.5 10*3/uL (ref 3.4–10.8)

## 2018-06-18 LAB — VITAMIN D 25 HYDROXY (VIT D DEFICIENCY, FRACTURES): VIT D 25 HYDROXY: 33.8 ng/mL (ref 30.0–100.0)

## 2018-06-18 LAB — URINALYSIS, COMPLETE
BILIRUBIN UA: NEGATIVE
GLUCOSE, UA: NEGATIVE
KETONES UA: NEGATIVE
LEUKOCYTES UA: NEGATIVE
Nitrite, UA: NEGATIVE
PROTEIN UA: NEGATIVE
RBC, UA: NEGATIVE
SPEC GRAV UA: 1.014 (ref 1.005–1.030)
Urobilinogen, Ur: 0.2 mg/dL (ref 0.2–1.0)
pH, UA: 5.5 (ref 5.0–7.5)

## 2018-06-18 LAB — LIPID PANEL
CHOL/HDL RATIO: 5.4 ratio — AB (ref 0.0–5.0)
Cholesterol, Total: 193 mg/dL (ref 100–199)
HDL: 36 mg/dL — AB (ref >39)
LDL CALC: 123 mg/dL — AB (ref 0–99)
Triglycerides: 172 mg/dL — ABNORMAL HIGH (ref 0–149)
VLDL CHOLESTEROL CAL: 34 mg/dL (ref 5–40)

## 2018-06-18 LAB — MICROSCOPIC EXAMINATION
BACTERIA UA: NONE SEEN
Casts: NONE SEEN /lpf
Epithelial Cells (non renal): NONE SEEN /hpf (ref 0–10)

## 2018-06-18 LAB — HEPATIC FUNCTION PANEL
ALT: 69 IU/L — AB (ref 0–44)
AST: 34 IU/L (ref 0–40)
Albumin: 4.5 g/dL (ref 3.5–5.5)
Alkaline Phosphatase: 118 IU/L — ABNORMAL HIGH (ref 39–117)
Bilirubin Total: 0.3 mg/dL (ref 0.0–1.2)
Bilirubin, Direct: 0.13 mg/dL (ref 0.00–0.40)
Total Protein: 6.8 g/dL (ref 6.0–8.5)

## 2018-06-23 ENCOUNTER — Other Ambulatory Visit: Payer: Self-pay | Admitting: *Deleted

## 2018-06-23 DIAGNOSIS — H01022 Squamous blepharitis right lower eyelid: Secondary | ICD-10-CM | POA: Diagnosis not present

## 2018-06-23 DIAGNOSIS — H01024 Squamous blepharitis left upper eyelid: Secondary | ICD-10-CM | POA: Diagnosis not present

## 2018-06-23 DIAGNOSIS — H01021 Squamous blepharitis right upper eyelid: Secondary | ICD-10-CM | POA: Diagnosis not present

## 2018-06-23 DIAGNOSIS — H01025 Squamous blepharitis left lower eyelid: Secondary | ICD-10-CM | POA: Diagnosis not present

## 2018-06-23 MED ORDER — VITAMIN D (ERGOCALCIFEROL) 1.25 MG (50000 UNIT) PO CAPS: 50000.0000 [IU] | ORAL_CAPSULE | ORAL | 0 refills | Status: DC

## 2018-06-24 ENCOUNTER — Other Ambulatory Visit: Payer: Self-pay | Admitting: Family Medicine

## 2018-06-26 NOTE — Telephone Encounter (Signed)
Last Vit D 06/17/18  33.8  DWM

## 2018-07-15 ENCOUNTER — Ambulatory Visit: Payer: BLUE CROSS/BLUE SHIELD | Admitting: Family Medicine

## 2018-07-18 ENCOUNTER — Ambulatory Visit (INDEPENDENT_AMBULATORY_CARE_PROVIDER_SITE_OTHER): Payer: BLUE CROSS/BLUE SHIELD | Admitting: Family Medicine

## 2018-07-18 ENCOUNTER — Encounter: Payer: Self-pay | Admitting: Family Medicine

## 2018-07-18 VITALS — BP 162/78 | HR 73 | Temp 102.3°F | Ht 75.0 in | Wt 353.0 lb

## 2018-07-18 DIAGNOSIS — R6889 Other general symptoms and signs: Secondary | ICD-10-CM

## 2018-07-18 DIAGNOSIS — R030 Elevated blood-pressure reading, without diagnosis of hypertension: Secondary | ICD-10-CM | POA: Diagnosis not present

## 2018-07-18 LAB — VERITOR FLU A/B WAIVED
Influenza A: NEGATIVE
Influenza B: NEGATIVE

## 2018-07-18 MED ORDER — ACETAMINOPHEN 500 MG PO TABS
1000.0000 mg | ORAL_TABLET | Freq: Once | ORAL | Status: AC
  Administered 2018-07-18: 1000 mg via ORAL

## 2018-07-18 MED ORDER — OSELTAMIVIR PHOSPHATE 75 MG PO CAPS: 75.0000 mg | ORAL_CAPSULE | Freq: Two times a day (BID) | ORAL | 0 refills | Status: AC

## 2018-07-18 NOTE — Progress Notes (Signed)
Subjective:    Patient ID: Jay Lang, male    DOB: 1975/11/18, 43 y.o.   MRN: [id number]  Chief Complaint:  Body aches, chills,cough, weak   HPI: Jay Lang is a 43 y.o. male presenting on 07/18/2018 for Body aches, chills,cough, weak  Pt presents today with complaints of body aches, fever, chills, cough, weakness, congestion, and headache. Pt states this started about 1.5 days ago and is getting worse. He had been taking sudafed and tylenol with minimal relief of symptoms. Pt denies known sick contact. He denies chest pain, shortness of breath, or leg swelling. Blood pressure noted to be elevated today. Pt reports he gets nervous when he has to be seen by a doctor. Pt states his blood pressure is usually ok at home. He denies confusion, focal neuro deficits, or visual disturbances. He has been taking sudafed for symptomatic relief.   Relevant past medical, surgical, family, and social history reviewed and updated as indicated.  Allergies and medications reviewed and updated.   Past Medical History:  Diagnosis Date  . Dyslipidemia (high LDL; low HDL)    also elevated tg  . GERD (gastroesophageal reflux disease)   . Hypothyroid   . Thyroid goiter    multinodular    Past Surgical History:  Procedure Laterality Date  . TOTAL THYROIDECTOMY  10/03/2010   papillary cancer    Social History   Socioeconomic History  . Marital status: Married    Spouse name: Not on file  . Number of children: Not on file  . Years of education: Not on file  . Highest education level: Not on file  Occupational History  . Not on file  Social Needs  . Financial resource strain: Not on file  . Food insecurity:    Worry: Not on file    Inability: Not on file  . Transportation needs:    Medical: Not on file    Non-medical: Not on file  Tobacco Use  . Smoking status: Never Smoker  . Smokeless tobacco: Never Used  Substance and Sexual Activity  . Alcohol use: Yes    Comment: once a month  .  Drug use: No  . Sexual activity: Not on file  Lifestyle  . Physical activity:    Days per week: Not on file    Minutes per session: Not on file  . Stress: Not on file  Relationships  . Social connections:    Talks on phone: Not on file    Gets together: Not on file    Attends religious service: Not on file    Active member of club or organization: Not on file    Attends meetings of clubs or organizations: Not on file    Relationship status: Not on file  . Intimate partner violence:    Fear of current or ex partner: Not on file    Emotionally abused: Not on file    Physically abused: Not on file    Forced sexual activity: Not on file  Other Topics Concern  . Not on file  Social History Narrative  . Not on file    Outpatient Encounter Medications as of 07/18/2018  Medication Sig  . atorvastatin (LIPITOR) 20 MG tablet TAKE 1 TABLET BY MOUTH EVERY DAY  . levothyroxine (SYNTHROID, LEVOTHROID) 137 MCG tablet Take 2 tablets by mouth daily.  Marland Kitchen NEXIUM 40 MG capsule TAKE ONE CAPSULE BY MOUTH EVERY DAY  . Vitamin D, Ergocalciferol, (DRISDOL) 1.25 MG (50000 UT) CAPS capsule TAKE  1 CAPSULE (50,000 UNITS TOTAL) BY MOUTH EVERY 7 (SEVEN) DAYS.  Marland Kitchen oseltamivir (TAMIFLU) 75 MG capsule Take 1 capsule (75 mg total) by mouth 2 (two) times daily for 5 days.   Facility-Administered Encounter Medications as of 07/18/2018  Medication  . acetaminophen (TYLENOL) tablet 1,000 mg    Allergies  Allergen Reactions  . Penicillins     Rash    Review of Systems  Constitutional: Positive for activity change, appetite change, chills, fatigue and fever. Negative for diaphoresis and unexpected weight change.  HENT: Positive for congestion, rhinorrhea and sore throat.   Eyes: Negative for photophobia and visual disturbance.  Respiratory: Positive for cough. Negative for chest tightness and shortness of breath.   Cardiovascular: Negative for chest pain, palpitations and leg swelling.  Gastrointestinal:  Negative for abdominal pain, constipation, diarrhea, nausea and vomiting.  Skin: Negative for rash.  Neurological: Positive for weakness (generalized) and headaches. Negative for dizziness, tremors, seizures, syncope, facial asymmetry, speech difficulty, light-headedness and numbness.  Psychiatric/Behavioral: Negative for confusion.  All other systems reviewed and are negative.       Objective:    BP (!) 162/78 (BP Location: Right Arm)   Pulse 73   Temp (!) 102.3 F (39.1 C) (Oral)   Ht 6' 3"  (1.905 m)   Wt (!) 353 lb (160.1 kg)   BMI 44.12 kg/m    Wt Readings from Last 3 Encounters:  07/18/18 (!) 353 lb (160.1 kg)  06/17/18 (!) 349 lb (158.3 kg)  03/11/18 (!) 349 lb (158.3 kg)    Physical Exam Vitals signs and nursing note reviewed.  Constitutional:      General: He is in acute distress.     Appearance: He is morbidly obese. He is ill-appearing. He is not toxic-appearing.  HENT:     Head: Normocephalic and atraumatic.     Right Ear: Hearing, ear canal and external ear normal. A middle ear effusion is present. Tympanic membrane is not perforated or erythematous.     Left Ear: Hearing, ear canal and external ear normal. A middle ear effusion is present. Tympanic membrane is not perforated or erythematous.     Nose: Congestion and rhinorrhea present. Rhinorrhea is clear.     Right Sinus: No maxillary sinus tenderness or frontal sinus tenderness.     Left Sinus: No maxillary sinus tenderness or frontal sinus tenderness.     Mouth/Throat:     Lips: Pink.     Mouth: Mucous membranes are moist.     Pharynx: Uvula midline. Posterior oropharyngeal erythema present. No oropharyngeal exudate.     Tonsils: No tonsillar exudate or tonsillar abscesses.  Eyes:     General: Lids are normal.     Conjunctiva/sclera: Conjunctivae normal.     Pupils: Pupils are equal, round, and reactive to light.  Neck:     Musculoskeletal: Full passive range of motion without pain and neck supple.    Cardiovascular:     Rate and Rhythm: Normal rate and regular rhythm.     Heart sounds: Normal heart sounds. No murmur. No friction rub. No gallop.   Pulmonary:     Effort: Pulmonary effort is normal. No respiratory distress.     Breath sounds: Normal breath sounds.  Lymphadenopathy:     Cervical: No cervical adenopathy.  Skin:    General: Skin is warm and dry.     Capillary Refill: Capillary refill takes less than 2 seconds.     Comments: Facial flushing  Neurological:  General: No focal deficit present.     Mental Status: He is alert and oriented to person, place, and time.  Psychiatric:        Mood and Affect: Mood normal.        Behavior: Behavior normal. Behavior is cooperative.        Thought Content: Thought content normal.        Judgment: Judgment normal.     Results for orders placed or performed in visit on 06/17/18  Microscopic Examination  Result Value Ref Range   WBC, UA 0-5 0 - 5 /hpf   RBC, UA 0-2 0 - 2 /hpf   Epithelial Cells (non renal) None seen 0 - 10 /hpf   Casts None seen None seen /lpf   Mucus, UA Present Not Estab.   Bacteria, UA None seen None seen/Few  BMP8+EGFR  Result Value Ref Range   Glucose 74 65 - 99 mg/dL   BUN 8 6 - 24 mg/dL   Creatinine, Ser 0.72 (L) 0.76 - 1.27 mg/dL   GFR calc non Af Amer 117 >59 mL/min/1.73   GFR calc Af Amer 135 >59 mL/min/1.73   BUN/Creatinine Ratio 11 9 - 20   Sodium 137 134 - 144 mmol/L   Potassium 4.3 3.5 - 5.2 mmol/L   Chloride 98 96 - 106 mmol/L   CO2 23 20 - 29 mmol/L   Calcium 9.5 8.7 - 10.2 mg/dL  CBC with Differential/Platelet  Result Value Ref Range   WBC 8.5 3.4 - 10.8 x10E3/uL   RBC 5.71 4.14 - 5.80 x10E6/uL   Hemoglobin 15.5 13.0 - 17.7 g/dL   Hematocrit 47.7 37.5 - 51.0 %   MCV 84 79 - 97 fL   MCH 27.1 26.6 - 33.0 pg   MCHC 32.5 31.5 - 35.7 g/dL   RDW 13.2 12.3 - 15.4 %   Platelets 350 150 - 450 x10E3/uL   Neutrophils 64 Not Estab. %   Lymphs 22 Not Estab. %   Monocytes 7 Not Estab. %    Eos 5 Not Estab. %   Basos 1 Not Estab. %   Neutrophils Absolute 5.6 1.4 - 7.0 x10E3/uL   Lymphocytes Absolute 1.8 0.7 - 3.1 x10E3/uL   Monocytes Absolute 0.6 0.1 - 0.9 x10E3/uL   EOS (ABSOLUTE) 0.4 0.0 - 0.4 x10E3/uL   Basophils Absolute 0.1 0.0 - 0.2 x10E3/uL   Immature Granulocytes 1 Not Estab. %   Immature Grans (Abs) 0.0 0.0 - 0.1 x10E3/uL  Lipid panel  Result Value Ref Range   Cholesterol, Total 193 100 - 199 mg/dL   Triglycerides 172 (H) 0 - 149 mg/dL   HDL 36 (L) >39 mg/dL   VLDL Cholesterol Cal 34 5 - 40 mg/dL   LDL Calculated 123 (H) 0 - 99 mg/dL   Chol/HDL Ratio 5.4 (H) 0.0 - 5.0 ratio  VITAMIN D 25 Hydroxy (Vit-D Deficiency, Fractures)  Result Value Ref Range   Vit D, 25-Hydroxy 33.8 30.0 - 100.0 ng/mL  Hepatic function panel  Result Value Ref Range   Total Protein 6.8 6.0 - 8.5 g/dL   Albumin 4.5 3.5 - 5.5 g/dL   Bilirubin Total 0.3 0.0 - 1.2 mg/dL   Bilirubin, Direct 0.13 0.00 - 0.40 mg/dL   Alkaline Phosphatase 118 (H) 39 - 117 IU/L   AST 34 0 - 40 IU/L   ALT 69 (H) 0 - 44 IU/L  PSA, total and free  Result Value Ref Range   Prostate Specific Ag, Serum 0.8 0.0 -  4.0 ng/mL   PSA, Free 0.12 N/A ng/mL   PSA, Free Pct 15.0 %  Urinalysis, Complete  Result Value Ref Range   Specific Gravity, UA 1.014 1.005 - 1.030   pH, UA 5.5 5.0 - 7.5   Color, UA Yellow Yellow   Appearance Ur Clear Clear   Leukocytes, UA Negative Negative   Protein, UA Negative Negative/Trace   Glucose, UA Negative Negative   Ketones, UA Negative Negative   RBC, UA Negative Negative   Bilirubin, UA Negative Negative   Urobilinogen, Ur 0.2 0.2 - 1.0 mg/dL   Nitrite, UA Negative Negative   Microscopic Examination Comment    Microscopic Examination See below:      Influenza negative in office.   Pertinent labs & imaging results that were available during my care of the patient were reviewed by me and considered in my medical decision making.  Assessment & Plan:  Attilio was seen today  for body aches, chills,cough, weak.  Diagnoses and all orders for this visit:  Flu-like symptoms Influenza negative in office. I have had previous negative influenza on day 1 to 2 of symptoms and then positive 2 days later. Due to clinical presentation of pt will treat empirically for influenza. Force fluids. Tylenol in office and pt aware to take at home for fever and pain control. Report any new or worsening symptoms.  -     Veritor Flu A/B Waived -     acetaminophen (TYLENOL) tablet 1,000 mg -     oseltamivir (TAMIFLU) 75 MG capsule; Take 1 capsule (75 mg total) by mouth 2 (two) times daily for 5 days.  Elevated blood pressure reading in office without diagnosis of hypertension DASH diet discussed. Blood pressure log provided. Pt will keep record of blood pressure and return to see PCP in 4 weeks for recheck.     Continue all other maintenance medications.  Follow up plan: Return in about 4 weeks (around 08/15/2018), or if symptoms worsen or fail to improve, for Dr. Laurance Flatten for BP recheck.  Educational handout given for DASH diet,   The above assessment and management plan was discussed with the patient. The patient verbalized understanding of and has agreed to the management plan. Patient is aware to call the clinic if symptoms persist or worsen. Patient is aware when to return to the clinic for a follow-up visit. Patient educated on when it is appropriate to go to the emergency department.   Monia Pouch, FNP-C [facility] [phone]

## 2018-07-18 NOTE — Patient Instructions (Signed)
DASH Eating Plan  DASH stands for "Dietary Approaches to Stop Hypertension." The DASH eating plan is a healthy eating plan that has been shown to reduce high blood pressure (hypertension). It may also reduce your risk for type 2 diabetes, heart disease, and stroke. The DASH eating plan may also help with weight loss.  What are tips for following this plan?    General guidelines   Avoid eating more than 2,300 mg (milligrams) of salt (sodium) a day. If you have hypertension, you may need to reduce your sodium intake to 1,500 mg a day.   Limit alcohol intake to no more than 1 drink a day for nonpregnant women and 2 drinks a day for men. One drink equals 12 oz of beer, 5 oz of wine, or 1 oz of hard liquor.   Work with your health care provider to maintain a healthy body weight or to lose weight. Ask what an ideal weight is for you.   Get at least 30 minutes of exercise that causes your heart to beat faster (aerobic exercise) most days of the week. Activities may include walking, swimming, or biking.   Work with your health care provider or diet and nutrition specialist (dietitian) to adjust your eating plan to your individual calorie needs.  Reading food labels     Check food labels for the amount of sodium per serving. Choose foods with less than 5 percent of the Daily Value of sodium. Generally, foods with less than 300 mg of sodium per serving fit into this eating plan.   To find whole grains, look for the word "whole" as the first word in the ingredient list.  Shopping   Buy products labeled as "low-sodium" or "no salt added."   Buy fresh foods. Avoid canned foods and premade or frozen meals.  Cooking   Avoid adding salt when cooking. Use salt-free seasonings or herbs instead of table salt or sea salt. Check with your health care provider or pharmacist before using salt substitutes.   Do not fry foods. Edison foods using healthy methods such as baking, boiling, grilling, and broiling instead.   [place] with  heart-healthy oils, such as olive, canola, soybean, or sunflower oil.  Meal planning   Eat a balanced diet that includes:  ? 5 or more servings of fruits and vegetables each day. At each meal, try to fill half of your plate with fruits and vegetables.  ? Up to 6-8 servings of whole grains each day.  ? Less than 6 oz of lean meat, poultry, or fish each day. A 3-oz serving of meat is about the same size as a deck of cards. One egg equals 1 oz.  ? 2 servings of low-fat dairy each day.  ? A serving of nuts, seeds, or beans 5 times each week.  ? Heart-healthy fats. Healthy fats called Omega-3 fatty acids are found in foods such as flaxseeds and coldwater fish, like sardines, salmon, and mackerel.   Limit how much you eat of the following:  ? Canned or prepackaged foods.  ? Food that is high in trans fat, such as fried foods.  ? Food that is high in saturated fat, such as fatty meat.  ? Sweets, desserts, sugary drinks, and other foods with added sugar.  ? Full-fat dairy products.   Do not salt foods before eating.   Try to eat at least 2 vegetarian meals each week.   Eat more home-cooked food and less restaurant, buffet, and fast food.     When eating at a restaurant, ask that your food be prepared with less salt or no salt, if possible.  What foods are recommended?  The items listed may not be a complete list. Talk with your dietitian about what dietary choices are best for you.  Grains  Whole-grain or whole-wheat bread. Whole-grain or whole-wheat pasta. Brown rice. Oatmeal. Quinoa. Bulgur. Whole-grain and low-sodium cereals. Pita bread. Low-fat, low-sodium crackers. Whole-wheat flour tortillas.  Vegetables  Fresh or frozen vegetables (raw, steamed, roasted, or grilled). Low-sodium or reduced-sodium tomato and vegetable juice. Low-sodium or reduced-sodium tomato sauce and tomato paste. Low-sodium or reduced-sodium canned vegetables.  Fruits  All fresh, dried, or frozen fruit. Canned fruit in natural juice (without  added sugar).  Meat and other protein foods  Skinless chicken or turkey. Ground chicken or turkey. Pork with fat trimmed off. Fish and seafood. Egg whites. Dried beans, peas, or lentils. Unsalted nuts, nut butters, and seeds. Unsalted canned beans. Lean cuts of beef with fat trimmed off. Low-sodium, lean deli meat.  Dairy  Low-fat (1%) or fat-free (skim) milk. Fat-free, low-fat, or reduced-fat cheeses. Nonfat, low-sodium ricotta or cottage cheese. Low-fat or nonfat yogurt. Low-fat, low-sodium cheese.  Fats and oils  Soft margarine without trans fats. Vegetable oil. Low-fat, reduced-fat, or light mayonnaise and salad dressings (reduced-sodium). Canola, safflower, olive, soybean, and sunflower oils. Avocado.  Seasoning and other foods  Herbs. Spices. Seasoning mixes without salt. Unsalted popcorn and pretzels. Fat-free sweets.  What foods are not recommended?  The items listed may not be a complete list. Talk with your dietitian about what dietary choices are best for you.  Grains  Baked goods made with fat, such as croissants, muffins, or some breads. Dry pasta or rice meal packs.  Vegetables  Creamed or fried vegetables. Vegetables in a cheese sauce. Regular canned vegetables (not low-sodium or reduced-sodium). Regular canned tomato sauce and paste (not low-sodium or reduced-sodium). Regular tomato and vegetable juice (not low-sodium or reduced-sodium). Pickles. Olives.  Fruits  Canned fruit in a light or heavy syrup. Fried fruit. Fruit in cream or butter sauce.  Meat and other protein foods  Fatty cuts of meat. Ribs. Fried meat. Bacon. Sausage. Bologna and other processed lunch meats. Salami. Fatback. Hotdogs. Bratwurst. Salted nuts and seeds. Canned beans with added salt. Canned or smoked fish. Whole eggs or egg yolks. Chicken or turkey with skin.  Dairy  Whole or 2% milk, cream, and half-and-half. Whole or full-fat cream cheese. Whole-fat or sweetened yogurt. Full-fat cheese. Nondairy creamers. Whipped toppings.  Processed cheese and cheese spreads.  Fats and oils  Butter. Stick margarine. Lard. Shortening. Ghee. Bacon fat. Tropical oils, such as coconut, palm kernel, or palm oil.  Seasoning and other foods  Salted popcorn and pretzels. Onion salt, garlic salt, seasoned salt, table salt, and sea salt. Worcestershire sauce. Tartar sauce. Barbecue sauce. Teriyaki sauce. Soy sauce, including reduced-sodium. Steak sauce. Canned and packaged gravies. Fish sauce. Oyster sauce. Cocktail sauce. Horseradish that you find on the shelf. Ketchup. Mustard. Meat flavorings and tenderizers. Bouillon cubes. Hot sauce and Tabasco sauce. Premade or packaged marinades. Premade or packaged taco seasonings. Relishes. Regular salad dressings.  Where to find more information:   National Heart, Lung, and Blood Institute: www.nhlbi.nih.gov   American Heart Association: www.heart.org  Summary   The DASH eating plan is a healthy eating plan that has been shown to reduce high blood pressure (hypertension). It may also reduce your risk for type 2 diabetes, heart disease, and stroke.   With the   DASH eating plan, you should limit salt (sodium) intake to 2,300 mg a day. If you have hypertension, you may need to reduce your sodium intake to 1,500 mg a day.   When on the DASH eating plan, aim to eat more fresh fruits and vegetables, whole grains, lean proteins, low-fat dairy, and heart-healthy fats.   Work with your health care provider or diet and nutrition specialist (dietitian) to adjust your eating plan to your individual calorie needs.  This information is not intended to replace advice given to you by your health care provider. Make sure you discuss any questions you have with your health care provider.  Document Released: 06/07/2011 Document Revised: 06/11/2016 Document Reviewed: 06/11/2016  Elsevier Interactive Patient Education  2019 Elsevier Inc.

## 2018-07-21 ENCOUNTER — Telehealth: Payer: Self-pay | Admitting: Family Medicine

## 2018-07-21 DIAGNOSIS — R05 Cough: Secondary | ICD-10-CM

## 2018-07-21 DIAGNOSIS — R059 Cough, unspecified: Secondary | ICD-10-CM

## 2018-07-21 MED ORDER — BENZONATATE 100 MG PO CAPS: 200.0000 mg | ORAL_CAPSULE | Freq: Three times a day (TID) | ORAL | 0 refills | Status: DC | PRN

## 2018-07-21 NOTE — Telephone Encounter (Signed)
DR Evette Doffing - can he have something for cough ? (coverage for [place])

## 2018-07-21 NOTE — Telephone Encounter (Signed)
Pt aware.

## 2018-07-25 ENCOUNTER — Encounter: Payer: Self-pay | Admitting: Family Medicine

## 2018-07-25 ENCOUNTER — Ambulatory Visit (INDEPENDENT_AMBULATORY_CARE_PROVIDER_SITE_OTHER): Payer: BLUE CROSS/BLUE SHIELD | Admitting: Family Medicine

## 2018-07-25 VITALS — BP 159/83 | HR 72 | Temp 97.5°F | Ht 75.0 in | Wt 347.0 lb

## 2018-07-25 DIAGNOSIS — J189 Pneumonia, unspecified organism: Secondary | ICD-10-CM

## 2018-07-25 DIAGNOSIS — J181 Lobar pneumonia, unspecified organism: Secondary | ICD-10-CM

## 2018-07-25 MED ORDER — CEFTRIAXONE SODIUM 1 G IJ SOLR
1.0000 g | Freq: Once | INTRAMUSCULAR | Status: AC
  Administered 2018-07-25: 1 g via INTRAMUSCULAR

## 2018-07-25 MED ORDER — AZITHROMYCIN 250 MG PO TABS: ORAL_TABLET | ORAL | 0 refills | Status: DC

## 2018-07-25 NOTE — Progress Notes (Signed)
   BP (!) 159/83   Pulse 72   Temp (!) 97.5 F (36.4 C) (Oral)   Ht 6\' 3"  (1.905 m)   Wt (!) 347 lb (157.4 kg)   SpO2 92%   BMI 43.37 kg/m    Subjective:    Patient ID: Jay Lang, male    DOB: 01-Nov-1975, 43 y.o.   MRN: [id number]  HPI: Jay Lang is a 43 y.o. male presenting on 07/25/2018 for Cough (Patient was seen 1/17 and states he is a little better.) and Nasal Congestion   HPI Patient is coming in today with sore throat and cough x 1 week. He was seen last week with fever and flu symptoms and was given Tamiflu. Now most symptoms have resolved, but he is hoarse and still has a cough. He is taking Best boy and Mucinex at night which help. He has shortness of breath, denies chest pain.  He feels like it is getting into his chest.  The fevers have resolved but the cough is still productive and still significantly bothering him.  Relevant past medical, surgical, family and social history reviewed and updated as indicated. Interim medical history since our last visit reviewed. Allergies and medications reviewed and updated.  Review of Systems  Constitutional: Negative for chills and fever.  HENT: Positive for postnasal drip, sore throat and voice change (hoarse). Negative for congestion and ear pain.   Respiratory: Positive for cough and shortness of breath.   Cardiovascular: Negative for chest pain.    Per HPI unless specifically indicated above       Objective:    BP (!) 159/83   Pulse 72   Temp (!) 97.5 F (36.4 C) (Oral)   Ht 6\' 3"  (1.905 m)   Wt (!) 347 lb (157.4 kg)   SpO2 92%   BMI 43.37 kg/m   Wt Readings from Last 3 Encounters:  07/25/18 (!) 347 lb (157.4 kg)  07/18/18 (!) 353 lb (160.1 kg)  06/17/18 (!) 349 lb (158.3 kg)    Physical Exam Constitutional:      General: He is not in acute distress.    Appearance: He is obese.  HENT:     Mouth/Throat:     Mouth: Mucous membranes are moist.  Cardiovascular:     Rate and Rhythm: Normal rate and  regular rhythm.     Heart sounds: Normal heart sounds.  Pulmonary:     Effort: Pulmonary effort is normal.     Breath sounds: Rales (left lower lobe) present.        Assessment & Plan:   Problem List Items Addressed This Visit    None    Visit Diagnoses    Community acquired pneumonia of left lower lobe of lung ([place])    -  Primary   Relevant Medications   cefTRIAXone (ROCEPHIN) injection 1 g (Completed)   azithromycin (ZITHROMAX) 250 MG tablet       Follow up plan: Return if symptoms worsen or fail to improve.  Counseling provided for all of the vaccine components No orders of the defined types were placed in this encounter.   [place], PA-S [facility] 07/29/2018, 10:05 PM  Patient seen and examined PA student and noted to have rales in the left lower lobe concerning for possible pneumonia or consolidation, will treat like pneumonia and give azithromycin and ceftriaxone. Caryl Pina, MD [facility] 07/29/2018, 10:06 PM

## 2018-08-04 ENCOUNTER — Other Ambulatory Visit: Payer: Self-pay | Admitting: Pediatrics

## 2018-08-04 DIAGNOSIS — R059 Cough, unspecified: Secondary | ICD-10-CM

## 2018-08-04 DIAGNOSIS — R05 Cough: Secondary | ICD-10-CM

## 2018-08-04 NOTE — Telephone Encounter (Signed)
Patient aware.

## 2018-08-04 NOTE — Telephone Encounter (Signed)
Needs to be seen

## 2018-09-19 ENCOUNTER — Other Ambulatory Visit: Payer: Self-pay | Admitting: Family Medicine

## 2018-09-19 NOTE — Telephone Encounter (Signed)
Last Vit D  06/17/18  33.8

## 2018-10-15 ENCOUNTER — Other Ambulatory Visit: Payer: Self-pay | Admitting: Family Medicine

## 2018-10-15 DIAGNOSIS — C73 Malignant neoplasm of thyroid gland: Secondary | ICD-10-CM | POA: Diagnosis not present

## 2018-10-15 DIAGNOSIS — E890 Postprocedural hypothyroidism: Secondary | ICD-10-CM | POA: Diagnosis not present

## 2018-10-15 DIAGNOSIS — R7301 Impaired fasting glucose: Secondary | ICD-10-CM | POA: Diagnosis not present

## 2018-10-15 NOTE — Telephone Encounter (Signed)
Please change patient's prescription to 40 mg 1 daily and give him 90 with 1 refill.

## 2018-10-15 NOTE — Telephone Encounter (Signed)
Will forward to Dr. Laurance Flatten to advise on refill.  Looks like Atorvastatin was increased to 40 mg per last lipid results on 06/18/2019.

## 2018-10-22 DIAGNOSIS — E7800 Pure hypercholesterolemia, unspecified: Secondary | ICD-10-CM | POA: Diagnosis not present

## 2018-10-22 DIAGNOSIS — R7301 Impaired fasting glucose: Secondary | ICD-10-CM | POA: Diagnosis not present

## 2018-10-22 DIAGNOSIS — C73 Malignant neoplasm of thyroid gland: Secondary | ICD-10-CM | POA: Diagnosis not present

## 2018-10-22 DIAGNOSIS — E890 Postprocedural hypothyroidism: Secondary | ICD-10-CM | POA: Diagnosis not present

## 2018-12-10 ENCOUNTER — Other Ambulatory Visit: Payer: Self-pay | Admitting: Family Medicine

## 2018-12-17 ENCOUNTER — Ambulatory Visit: Payer: BLUE CROSS/BLUE SHIELD | Admitting: Family Medicine

## 2018-12-25 ENCOUNTER — Ambulatory Visit (INDEPENDENT_AMBULATORY_CARE_PROVIDER_SITE_OTHER): Payer: BC Managed Care – PPO | Admitting: Family Medicine

## 2018-12-25 ENCOUNTER — Encounter: Payer: Self-pay | Admitting: Family Medicine

## 2018-12-25 ENCOUNTER — Other Ambulatory Visit: Payer: Self-pay

## 2018-12-25 DIAGNOSIS — E785 Hyperlipidemia, unspecified: Secondary | ICD-10-CM | POA: Diagnosis not present

## 2018-12-25 DIAGNOSIS — K219 Gastro-esophageal reflux disease without esophagitis: Secondary | ICD-10-CM

## 2018-12-25 DIAGNOSIS — C73 Malignant neoplasm of thyroid gland: Secondary | ICD-10-CM | POA: Diagnosis not present

## 2018-12-25 DIAGNOSIS — E559 Vitamin D deficiency, unspecified: Secondary | ICD-10-CM | POA: Diagnosis not present

## 2018-12-25 DIAGNOSIS — E782 Mixed hyperlipidemia: Secondary | ICD-10-CM

## 2018-12-25 NOTE — Progress Notes (Signed)
Virtual Visit Via telephone Note I connected with@ on 12/25/18 by telephone and verified that I am speaking with the correct person or authorized healthcare agent using two identifiers. Dmetrius Ambs is currently located at home and there are no unauthorized people in close proximity. I completed this visit while in a private location in my home .  This visit type was conducted due to national recommendations for restrictions regarding the COVID-19 Pandemic (e.g. social distancing).  This format is felt to be most appropriate for this patient at this time.  All issues noted in this document were discussed and addressed.  No physical exam was performed.    I discussed the limitations, risks, security and privacy concerns of performing an evaluation and management service by telephone and the availability of in person appointments. I also discussed with the patient that there may be a patient responsible charge related to this service. The patient expressed understanding and agreed to proceed.   Date:  12/25/2018    ID:  Melinda Crutch      1975-08-10        [id number]   Patient Care Team Patient Care Team: Chipper Herb, MD as PCP - General (Family Medicine)  Reason for Visit: Primary Care Follow-up     History of Present Illness & Review of Systems:     Lois Ostrom is a 43 y.o. year old male primary care patient that presents today for a telehealth visit.  Patient is pleasant and doing well and currently working from home.  He has a newborn son is about 25 months old.  Today he denies any increased problems with chest pain or shortness of breath and he says he has no chest pain or shortness of breath anymore than usual.  He does work in the yard sweats and has no discomfort other than what he would normally expect.  He has no trouble with swallowing heartburn indigestion nausea vomiting diarrhea blood in the stool or black tarry bowel movements.  There is no family history of colon cancer.  He  denies any trouble with passing his water including burning pain or frequency.  There is no family history that he is aware of of prostate cancer and his dad did die at a younger age many years ago.  He is followed regularly by Dr. Michiel Sites for his thyroid cancer and she sees him yearly.  He last saw her in April of this year.  He is due to get lab work and will come to the office for this.  Because of his increased risk factors we will arrange for him to have a visit with the cardiologist on a risk factor basis not because he is symptomatic.  Review of systems as stated, otherwise negative.  The patient does not have symptoms concerning for COVID-19 infection (fever, chills, cough, or new shortness of breath).      Current Medications (Verified) Allergies as of 12/25/2018      Reactions   Penicillins    Rash      Medication List       Accurate as of December 25, 2018 10:42 AM. If you have any questions, ask your nurse or doctor.        atorvastatin 20 MG tablet Commonly known as: LIPITOR TAKE 1 TABLET BY MOUTH EVERY DAY   azithromycin 250 MG tablet Commonly known as: ZITHROMAX Take 2 the first day and then one each day after.   benzonatate 100 MG capsule Commonly  known as: TESSALON Take 2 capsules (200 mg total) by mouth 3 (three) times daily as needed for cough.   levothyroxine 137 MCG tablet Commonly known as: SYNTHROID Take 2 tablets by mouth daily.   NexIUM 40 MG capsule Generic drug: esomeprazole TAKE ONE CAPSULE BY MOUTH EVERY DAY   Vitamin D (Ergocalciferol) 1.25 MG (50000 UT) Caps capsule Commonly known as: DRISDOL TAKE 1 CAPSULE (50,000 UNITS TOTAL) BY MOUTH EVERY 7 (SEVEN) DAYS.           Allergies (Verified)    Penicillins  Past Medical History Past Medical History:  Diagnosis Date  . Dyslipidemia (high LDL; low HDL)    also elevated tg  . GERD (gastroesophageal reflux disease)   . Hypothyroid   . Thyroid goiter    multinodular     Past Surgical  History:  Procedure Laterality Date  . TOTAL THYROIDECTOMY  10/03/2010   papillary cancer    Social History   Socioeconomic History  . Marital status: Married    Spouse name: Not on file  . Number of children: Not on file  . Years of education: Not on file  . Highest education level: Not on file  Occupational History  . Not on file  Social Needs  . Financial resource strain: Not on file  . Food insecurity    Worry: Not on file    Inability: Not on file  . Transportation needs    Medical: Not on file    Non-medical: Not on file  Tobacco Use  . Smoking status: Never Smoker  . Smokeless tobacco: Never Used  Substance and Sexual Activity  . Alcohol use: Yes    Comment: once a month  . Drug use: No  . Sexual activity: Not on file  Lifestyle  . Physical activity    Days per week: Not on file    Minutes per session: Not on file  . Stress: Not on file  Relationships  . Social Herbalist on phone: Not on file    Gets together: Not on file    Attends religious service: Not on file    Active member of club or organization: Not on file    Attends meetings of clubs or organizations: Not on file    Relationship status: Not on file  Other Topics Concern  . Not on file  Social History Narrative  . Not on file     Family History  Problem Relation Age of Onset  . Cancer Mother        Breast      Labs/Other Tests and Data Reviewed:    Wt Readings from Last 3 Encounters:  07/25/18 (!) 347 lb (157.4 kg)  07/18/18 (!) 353 lb (160.1 kg)  06/17/18 (!) 349 lb (158.3 kg)   Temp Readings from Last 3 Encounters:  07/25/18 (!) 97.5 F (36.4 C) (Oral)  07/18/18 (!) 102.3 F (39.1 C) (Oral)  06/17/18 97.9 F (36.6 C) (Oral)   BP Readings from Last 3 Encounters:  07/25/18 (!) 159/83  07/18/18 (!) 162/78  06/17/18 134/80   Pulse Readings from Last 3 Encounters:  07/25/18 72  07/18/18 73  06/17/18 78     No results found for: HGBA1C Lab Results  Component  Value Date   LDLCALC 123 (H) 06/17/2018   CREATININE 0.72 (L) 06/17/2018       Chemistry      Component Value Date/Time   NA 137 06/17/2018 1645   K 4.3 06/17/2018  1645   CL 98 06/17/2018 1645   CO2 23 06/17/2018 1645   BUN 8 06/17/2018 1645   CREATININE 0.72 (L) 06/17/2018 1645      Component Value Date/Time   CALCIUM 9.5 06/17/2018 1645   ALKPHOS 118 (H) 06/17/2018 1645   AST 34 06/17/2018 1645   ALT 69 (H) 06/17/2018 1645   BILITOT 0.3 06/17/2018 1645         OBSERVATIONS/ OBJECTIVE:     The patient today says his blood pressure usually runs in the 130s over the 60s.  The weight remains the same as it was previously and he attributes this to not being as active and being at home doing his work at home.  I did reemphasize him again that he needs to get his weight under control through diet and exercise.  He had an eye exam in December and was prescribed glasses by Dr. Katy Fitch and will see him again in 2 years.  Physical exam deferred due to nature of telephonic visit.  ASSESSMENT & PLAN    Time:   Today, I have spent 28 minutes with the patient via telephone discussing the above including Covid precautions.     Visit Diagnoses: 1. Thyroid cancer, multifocal papillary carcinoma, T1a, N0, Mx -Follow-up with Dr. Chalmers Cater as planned on a yearly basis.  Next visit would be in the spring 2021.  2. Dyslipidemia -Continue with aggressive therapeutic lifestyle changes including diet and exercise to achieve weight loss and lower cholesterol.  Continue with atorvastatin 20 mg daily as currently doing pending results of lab work  3. Vitamin D deficiency -Continue with vitamin D replacement at 50,000 units weekly pending results of lab work  4. Gastroesophageal reflux disease, esophagitis presence not specified -Continue with Nexium as this seems to be controlling reflux.  Continue with all efforts to lose weight  5. Mixed hyperlipidemia -Continue with statin therapy and schedule  visit with cardiology because of a patient who has increased risk factors for heart disease  6. Morbid obesity ([place]) -Continue to make every effort to lose weight through diet and exercise  Patient Instructions  Continue to make every effort to lose weight through diet and exercise Stay active physically Follow-up with endocrinologist regularly because of thyroid cancer--next visit will be due in the spring 2021 Eat healthy and eat less  get eye exams regularly Continue to practice good hand and respiratory hygiene Get eye exams every 2 years Schedule visit with cardiologist this fall because of increased risk factors for heart disease in an asymptomatic patient.      The above assessment and management plan was discussed with the patient. The patient verbalized understanding of and has agreed to the management plan. Patient is aware to call the clinic if symptoms persist or worsen. Patient is aware when to return to the clinic for a follow-up visit. Patient educated on when it is appropriate to go to the emergency department.    Chipper Herb, MD [address], [place], [place] [address] Ph [phone]   Arrie Senate MD

## 2018-12-25 NOTE — Patient Instructions (Addendum)
Continue to make every effort to lose weight through diet and exercise Stay active physically Follow-up with endocrinologist regularly because of thyroid cancer--next visit will be due in the spring 2021 Eat healthy and eat less  get eye exams regularly Continue to practice good hand and respiratory hygiene Get eye exams every 2 years Schedule visit with cardiologist this fall because of increased risk factors for heart disease in an asymptomatic patient.

## 2018-12-25 NOTE — Addendum Note (Signed)
Addended by: Zannie Cove on: 12/25/2018 01:21 PM   Modules accepted: Orders

## 2019-01-12 DIAGNOSIS — K219 Gastro-esophageal reflux disease without esophagitis: Secondary | ICD-10-CM | POA: Diagnosis not present

## 2019-01-12 DIAGNOSIS — K200 Eosinophilic esophagitis: Secondary | ICD-10-CM | POA: Diagnosis not present

## 2019-02-17 ENCOUNTER — Telehealth: Payer: Self-pay | Admitting: Family Medicine

## 2019-02-17 DIAGNOSIS — E785 Hyperlipidemia, unspecified: Secondary | ICD-10-CM

## 2019-02-17 DIAGNOSIS — E782 Mixed hyperlipidemia: Secondary | ICD-10-CM

## 2019-02-17 NOTE — Telephone Encounter (Signed)
Go ahead and do a new referral for the patient, diagnosis dyslipidemia and morbid obesity

## 2019-02-24 IMAGING — US US ABDOMEN COMPLETE
1 series · 13 of 25 positions shown · non-contrast
Comparison: None.

CLINICAL DATA: Elevated liver enzymes

EXAM:
ABDOMEN ULTRASOUND COMPLETE

[Series 1: us abdomen complete · 0.25mm/px · 13 of 88 slices shown]
[im 1/88]
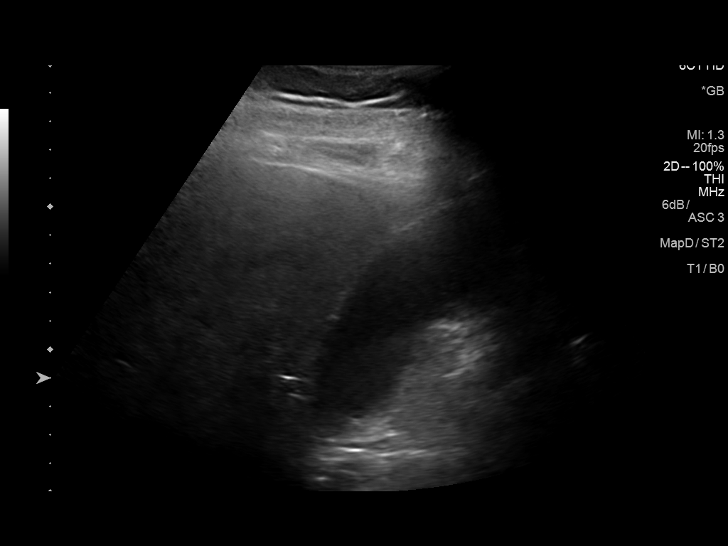
[im 8/88]
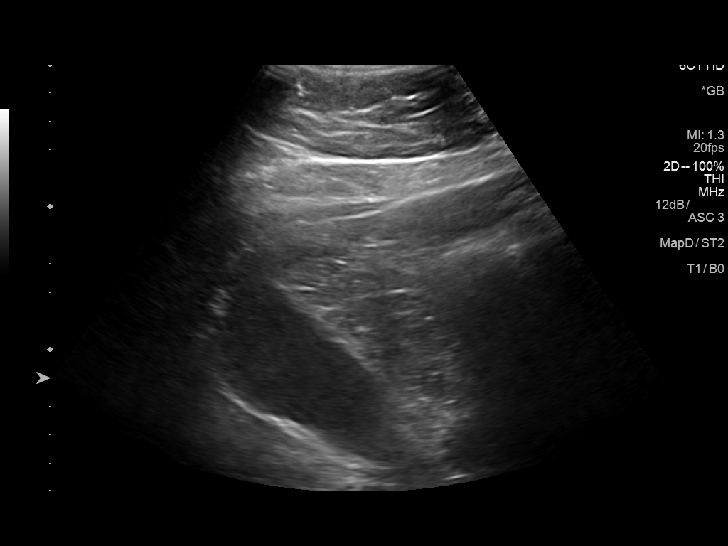
[im 15/88]
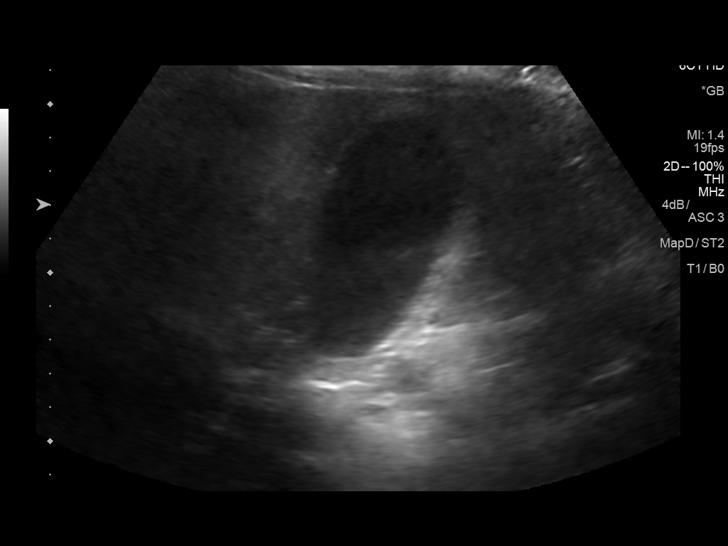
[im 22/88]
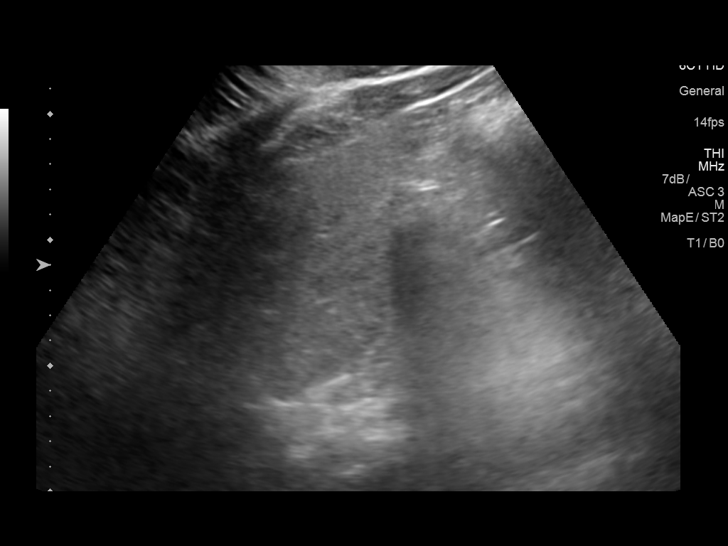
[im 30/88]
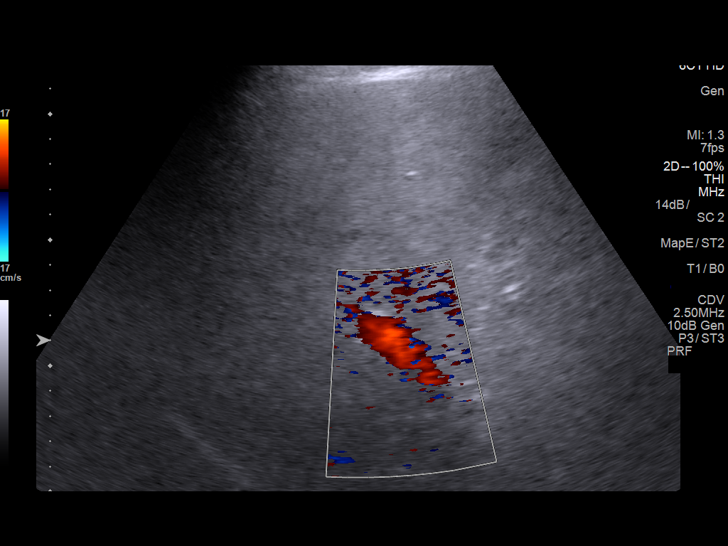
[im 37/88]
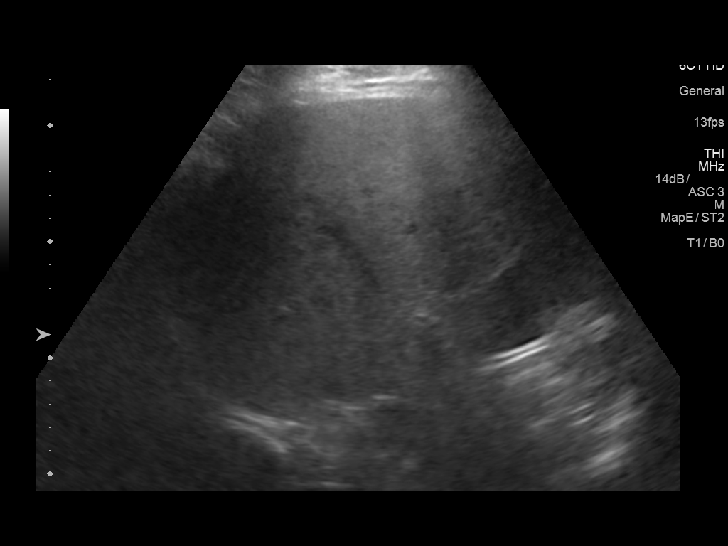
[im 44/88]
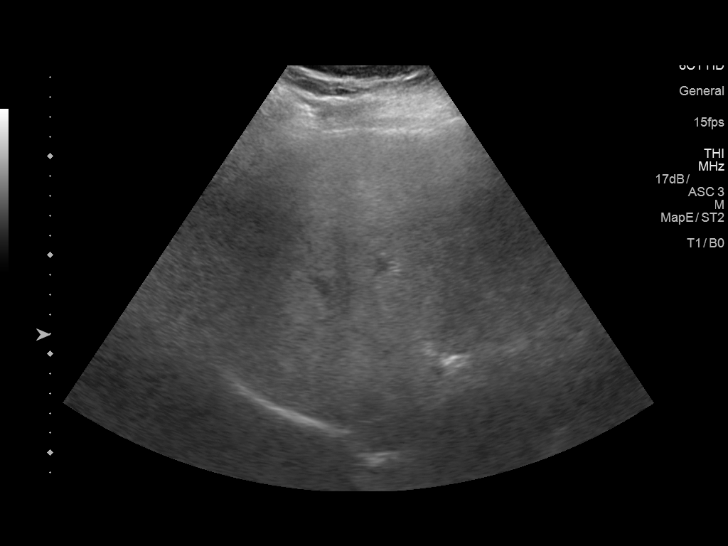
[im 51/88]
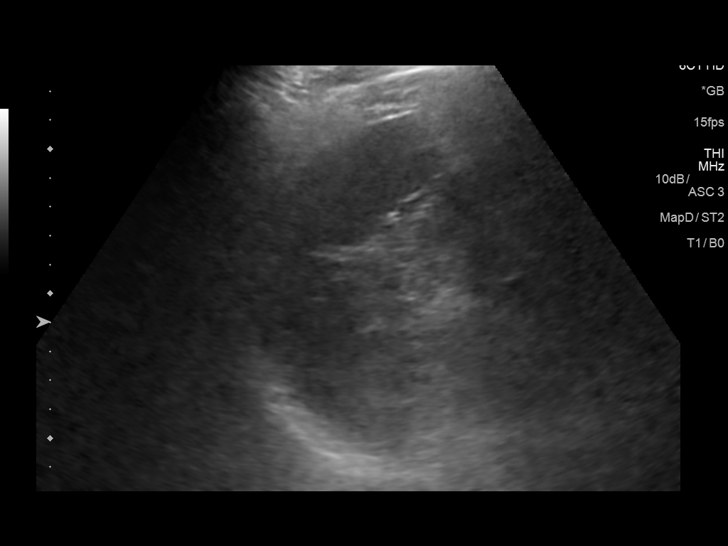
[im 59/88]
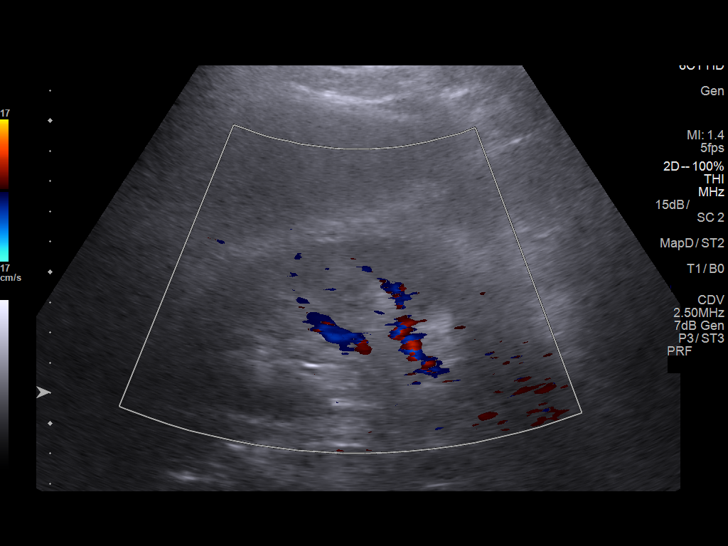
[im 66/88]
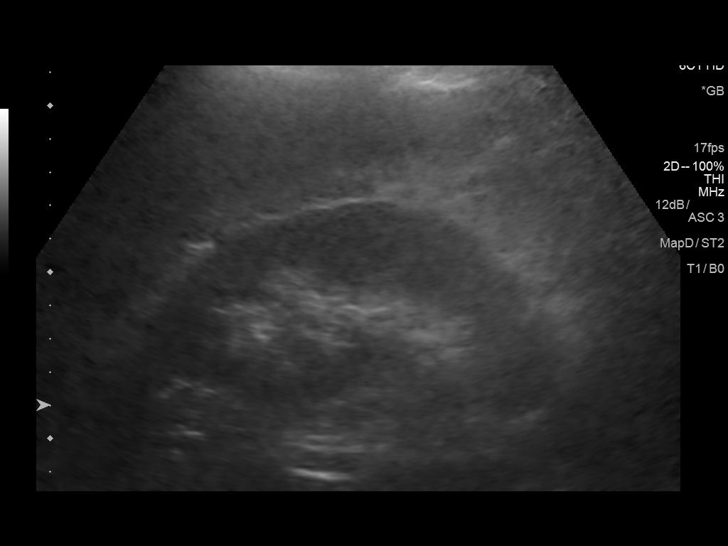
[im 73/88]
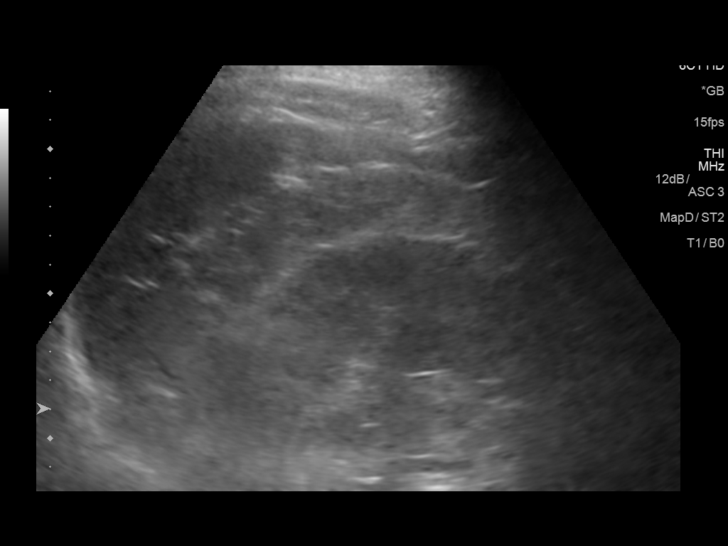
[im 80/88]
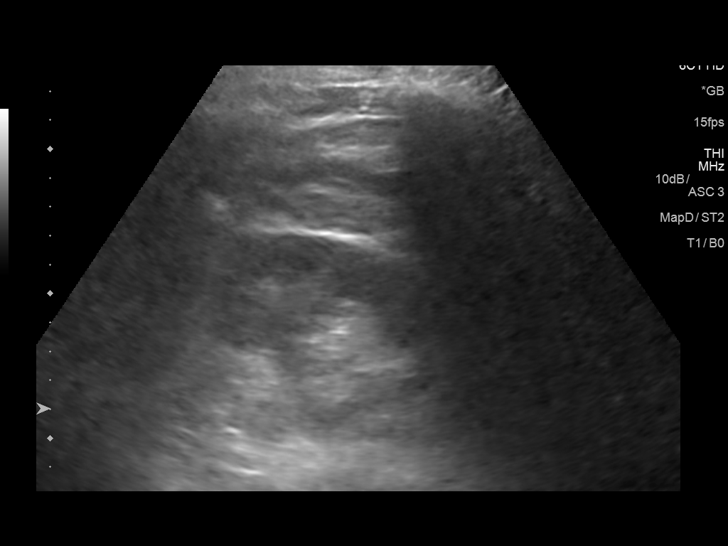
[im 88/88]
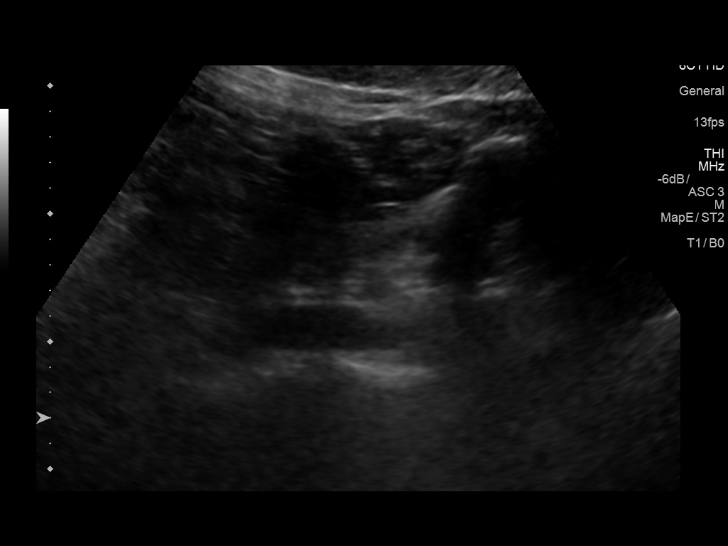

[13 of 25 positions shown; findings below may reference images not displayed]

FINDINGS: Gallbladder: No gallstones or wall thickening visualized. There is
no pericholecystic fluid. No sonographic Murphy sign noted by
sonographer.

Common bile duct: Diameter: 6 mm. No intrahepatic, common hepatic,
or common bile duct dilatation.

Liver: No focal lesion identified. Liver echogenicity is increased
diffusely. Portal vein is patent on color Doppler imaging with
normal direction of blood flow towards the liver.

IVC: No abnormality visualized. Most of the infrahepatic inferior
vena cava obscured by gas.

Pancreas: Visualized portion unremarkable. Much of the pancreas is
obscured by gas.

Spleen: Size and appearance within normal limits.

Right Kidney: Length: 12.6 cm. Echogenicity within normal limits. No
mass or hydronephrosis visualized.

Left Kidney: Length: 13.2 cm. Echogenicity within normal limits. No
mass or hydronephrosis visualized.

Abdominal aorta: No aneurysm visualized.

Other findings: No demonstrable ascites.
IMPRESSION: 1. Increased liver echogenicity, a finding most likely indicative of
hepatic steatosis. While no focal liver lesions are evident on this
study, it must be cautioned that the sensitivity of ultrasound for
detection of focal liver lesions is diminished in this circumstance.

2. Much of the inferior vena cava and pancreas are obscured by gas.
Visualized portions of the structures appear unremarkable.

3.  Study otherwise unremarkable.

## 2019-02-26 ENCOUNTER — Other Ambulatory Visit: Payer: Self-pay | Admitting: *Deleted

## 2019-03-02 ENCOUNTER — Other Ambulatory Visit: Payer: Self-pay | Admitting: Family Medicine

## 2019-03-24 DIAGNOSIS — Z720 Tobacco use: Secondary | ICD-10-CM | POA: Insufficient documentation

## 2019-03-24 DIAGNOSIS — E118 Type 2 diabetes mellitus with unspecified complications: Secondary | ICD-10-CM | POA: Insufficient documentation

## 2019-03-24 NOTE — Progress Notes (Signed)
Cardiology Office Note   Date:  03/25/2019   ID:  Jay Lang, DOB 10-24-1975, MRN [id number]  PCP:  Dettinger, Fransisca Kaufmann, MD  Cardiologist:   No primary care provider on file. Referring:  Dettinger, Fransisca Kaufmann, MD  Chief Complaint  Patient presents with  . Obesity      History of Present Illness: Jay Lang is a 43 y.o. male who is referred by Dettinger, Fransisca Kaufmann, MD for evaluation of multiple cardiovascular risk factors and dyslipidemia.  The patient has no past cardiac history.  He does have significant cardiovascular risk factors however.  He is not particularly active though he has to climb stairs.  He might get a little short of breath when he does this.  He denies any chest pressure, neck or arm discomfort.  She has no palpitations, presyncope or syncope.  He has had no weight gain or edema other than some very trace lower extremity edema at the end of the day.  He has a sedentary job.  He drinks a lot of reviewed.  He has been overweight for many years.  Past Medical History:  Diagnosis Date  . Dyslipidemia (high LDL; low HDL)    also elevated tg  . GERD (gastroesophageal reflux disease)   . Hypothyroid   . Thyroid goiter    multinodular    Past Surgical History:  Procedure Laterality Date  . TOTAL THYROIDECTOMY  10/03/2010   papillary cancer     Current Outpatient Medications  Medication Sig Dispense Refill  . atorvastatin (LIPITOR) 20 MG tablet TAKE 1 TABLET BY MOUTH EVERY DAY 90 tablet 1  . benzonatate (TESSALON) 100 MG capsule Take 2 capsules (200 mg total) by mouth 3 (three) times daily as needed for cough. 30 capsule 0  . levothyroxine (SYNTHROID, LEVOTHROID) 137 MCG tablet Take 2 tablets by mouth daily.    Marland Kitchen NEXIUM 40 MG capsule TAKE ONE CAPSULE BY MOUTH EVERY DAY 30 capsule 3   No current facility-administered medications for this visit.     Allergies:   Penicillins    Social History:  The patient  reports that he has never smoked. He has never used  smokeless tobacco. He reports current alcohol use. He reports that he does not use drugs.   Family History:  The patient's family history includes Cancer in his mother.    ROS:  Please see the history of present illness.   Otherwise, review of systems are positive for none.   All other systems are reviewed and negative.    PHYSICAL EXAM: VS:  BP (!) 170/98   Pulse 68   Ht 6\' 3"  (1.905 m)   Wt (!) 355 lb (161 kg)   BMI 44.37 kg/m  , BMI Body mass index is 44.37 kg/m. GENERAL:  Well appearing HEENT:  Pupils equal round and reactive, fundi not visualized, oral mucosa unremarkable NECK:  No jugular venous distention, waveform within normal limits, carotid upstroke brisk and symmetric, no bruits, no thyromegaly LYMPHATICS:  No cervical, inguinal adenopathy LUNGS:  Clear to auscultation bilaterally BACK:  No CVA tenderness CHEST:  Unremarkable HEART:  PMI not displaced or sustained,S1 and S2 within normal limits, no S3, no S4, no clicks, no rubs, no murmurs ABD:  Flat, positive bowel sounds normal in frequency in pitch, no bruits, no rebound, no guarding, no midline pulsatile mass, no hepatomegaly, no splenomegaly EXT:  2 plus pulses throughout, no edema, no cyanosis no clubbing SKIN:  No rashes no nodules NEURO:  Cranial  nerves II through XII grossly intact, motor grossly intact throughout [facility]:  Cognitively intact, oriented to person place and time    EKG:  EKG is not ordered today. The ekg ordered 06/17/2018 demonstrates sinus rhythm, rate 80, axis within normal limits, intervals with short PR but otherwise unremarkable, no acute ST-T wave changes.   Recent Labs: 06/17/2018: ALT 69; BUN 8; Creatinine, Ser 0.72; Hemoglobin 15.5; Platelets 350; Potassium 4.3; Sodium 137    Lipid Panel    Component Value Date/Time   CHOL 193 06/17/2018 1645   TRIG 172 (H) 06/17/2018 1645   TRIG 222 (H) 06/30/2014 1141   HDL 36 (L) 06/17/2018 1645   HDL 32 (L) 06/30/2014 1141   CHOLHDL 5.4  (H) 06/17/2018 1645   LDLCALC 123 (H) 06/17/2018 1645   LDLDIRECT 157 (H) 01/06/2015 0921      Wt Readings from Last 3 Encounters:  03/25/19 (!) 355 lb (161 kg)  07/25/18 (!) 347 lb (157.4 kg)  07/18/18 (!) 353 lb (160.1 kg)      Other studies Reviewed: Additional studies/ records that were reviewed today include: Labs. Review of the above records demonstrates:  Please see elsewhere in the note.     ASSESSMENT AND PLAN:  MORBID OBESITY:    We a long discussion about this.  I gave him very specific instructions about diet and exercise and goals for therapy.  I can see him back to discuss this.  The first thing he has to do is stop drinking [facility].  DYSLIPIDEMIA: I think ultimately I like to have him off statins.  I would like him to control his lipids with diet.  For now I will make no changes but we talked about specific strategies.    Current medicines are reviewed at length with the patient today.  The patient does not have concerns regarding medicines.  The following changes have been made:  no change  Labs/ tests ordered today include: None No orders of the defined types were placed in this encounter.    Disposition:   FU with in six months.     Signed, Minus Breeding, MD  03/25/2019 2:38 PM    [facility]

## 2019-03-25 ENCOUNTER — Other Ambulatory Visit: Payer: Self-pay

## 2019-03-25 ENCOUNTER — Encounter: Payer: Self-pay | Admitting: Cardiology

## 2019-03-25 ENCOUNTER — Ambulatory Visit (INDEPENDENT_AMBULATORY_CARE_PROVIDER_SITE_OTHER): Payer: BC Managed Care – PPO | Admitting: Cardiology

## 2019-03-25 DIAGNOSIS — E118 Type 2 diabetes mellitus with unspecified complications: Secondary | ICD-10-CM | POA: Diagnosis not present

## 2019-03-25 DIAGNOSIS — E785 Hyperlipidemia, unspecified: Secondary | ICD-10-CM

## 2019-03-25 NOTE — Patient Instructions (Signed)
Medication Instructions:  The current medical regimen is effective;  continue present plan and medications.  If you need a refill on your cardiac medications before your next appointment, please call your pharmacy.   Follow-Up: Follow up in 6 months with Dr. Hochrein.  You will receive a letter in the mail 2 months before you are due.  Please call us when you receive this letter to schedule your follow up appointment.  Thank you for choosing [facility]!!      

## 2019-04-23 ENCOUNTER — Other Ambulatory Visit: Payer: Self-pay | Admitting: Family Medicine

## 2019-04-24 ENCOUNTER — Other Ambulatory Visit: Payer: Self-pay

## 2019-04-27 ENCOUNTER — Ambulatory Visit (INDEPENDENT_AMBULATORY_CARE_PROVIDER_SITE_OTHER): Payer: BC Managed Care – PPO | Admitting: Family Medicine

## 2019-04-27 ENCOUNTER — Encounter: Payer: Self-pay | Admitting: Family Medicine

## 2019-04-27 VITALS — BP 153/95 | HR 77 | Temp 98.6°F | Ht 75.0 in | Wt 354.4 lb

## 2019-04-27 DIAGNOSIS — E8881 Metabolic syndrome: Secondary | ICD-10-CM | POA: Diagnosis not present

## 2019-04-27 DIAGNOSIS — E785 Hyperlipidemia, unspecified: Secondary | ICD-10-CM | POA: Diagnosis not present

## 2019-04-27 DIAGNOSIS — E039 Hypothyroidism, unspecified: Secondary | ICD-10-CM

## 2019-04-27 DIAGNOSIS — Z85850 Personal history of malignant neoplasm of thyroid: Secondary | ICD-10-CM

## 2019-04-27 DIAGNOSIS — Z23 Encounter for immunization: Secondary | ICD-10-CM

## 2019-04-27 DIAGNOSIS — E118 Type 2 diabetes mellitus with unspecified complications: Secondary | ICD-10-CM | POA: Diagnosis not present

## 2019-04-27 NOTE — Progress Notes (Signed)
BP (!) 153/95   Pulse 77   Temp 98.6 F (37 C) (Temporal)   Ht _0  (1.905 m)   Wt (!) 354 lb 6.4 oz (160.8 kg)   SpO2 93%   BMI 44.30 kg/m    Subjective:   Patient ID: Jay Lang, male    DOB: 1976-03-21, 43 y.o.   MRN: [id number]  HPI: Jay Lang is a 43 y.o. male presenting on 04/27/2019 for Establish Care (4 month follow up)   HPI Type 2 diabetes mellitus Patient comes in today for recheck of his diabetes. Patient has been currently taking no medication and has been doing diet controlled. Patient is not currently on an ACE inhibitor/ARB. Patient has not seen an ophthalmologist this year. Patient denies any issues with their feet.  Patient is morbidly obese and discussed getting back on the wagon and getting his weight down.  Hyperlipidemia Patient is coming in for recheck of his hyperlipidemia. The patient is currently taking atorvastatin. They deny any issues with myalgias or history of liver damage from it. They deny any focal numbness or weakness or chest pain.   Hypothyroidism recheck, will history of thyroid cancer Patient is coming in for thyroid recheck today as well. They deny any issues with hair changes or heat or cold problems or diarrhea or constipation. They deny any chest pain or palpitations. They are currently on levothyroxine to 274 micrograms   Relevant past medical, surgical, family and social history reviewed and updated as indicated. Interim medical history since our last visit reviewed. Allergies and medications reviewed and updated.  Review of Systems  Constitutional: Negative for chills and fever.  Respiratory: Negative for shortness of breath and wheezing.   Cardiovascular: Negative for chest pain and leg swelling.  Musculoskeletal: Negative for back pain and gait problem.  Skin: Negative for rash.  Neurological: Negative for dizziness and weakness.  All other systems reviewed and are negative.   Per HPI unless specifically indicated above    Allergies as of 04/27/2019      Reactions   Penicillins    Rash      Medication List       Accurate as of April 27, 2019 11:59 PM. If you have any questions, ask your nurse or doctor.        STOP taking these medications   benzonatate 100 MG capsule Commonly known as: TESSALON Stopped by: Fransisca Kaufmann Caitlyn Buchanan, MD     TAKE these medications   atorvastatin 20 MG tablet Commonly known as: LIPITOR TAKE 1 TABLET BY MOUTH EVERY DAY   levothyroxine 137 MCG tablet Commonly known as: SYNTHROID Take 2 tablets by mouth daily.   NexIUM 40 MG capsule Generic drug: esomeprazole TAKE ONE CAPSULE BY MOUTH EVERY DAY        Objective:   BP (!) 153/95   Pulse 77   Temp 98.6 F (37 C) (Temporal)   Ht _1  (1.905 m)   Wt (!) 354 lb 6.4 oz (160.8 kg)   SpO2 93%   BMI 44.30 kg/m   Wt Readings from Last 3 Encounters:  04/27/19 (!) 354 lb 6.4 oz (160.8 kg)  03/25/19 (!) 355 lb (161 kg)  07/25/18 (!) 347 lb (157.4 kg)    Physical Exam Vitals signs and nursing note reviewed.  Constitutional:      General: He is not in acute distress.    Appearance: He is well-developed. He is not diaphoretic.  Eyes:     General: No scleral icterus.  Conjunctiva/sclera: Conjunctivae normal.  Neck:     Musculoskeletal: Neck supple.     Thyroid: No thyromegaly.  Cardiovascular:     Rate and Rhythm: Normal rate and regular rhythm.     Heart sounds: Normal heart sounds. No murmur.  Pulmonary:     Effort: Pulmonary effort is normal. No respiratory distress.     Breath sounds: Normal breath sounds. No wheezing.  Musculoskeletal: Normal range of motion.  Lymphadenopathy:     Cervical: No cervical adenopathy.  Skin:    General: Skin is warm and dry.     Findings: No rash.  Neurological:     Mental Status: He is alert and oriented to person, place, and time.     Coordination: Coordination normal.  Psychiatric:        Behavior: Behavior normal.       Assessment & Plan:   Problem  List Items Addressed This Visit      Endocrine   Type 2 diabetes mellitus with complication, without long-term current use of insulin ([place]) - Primary   Relevant Orders   Bayer DCA Hb A1c Waived   CBC with Differential/Platelet   CMP14+EGFR   Hypothyroid   Relevant Orders   CBC with Differential/Platelet     Other   History of thyroid cancer   Dyslipidemia   Relevant Orders   Lipid panel   Metabolic syndrome   Morbid obesity ([place])    Other Visit Diagnoses    Need for immunization against influenza       Relevant Orders   Flu Vaccine QUAD 36+ mos IM (Completed)      Continue current medication, will check blood work for patient Follow up plan: Return in 6 months (on 10/26/2019), or if symptoms worsen or fail to improve.  Counseling provided for all of the vaccine components Orders Placed This Encounter  Procedures  . Flu Vaccine QUAD 36+ mos IM  . Bayer DCA Hb A1c Waived  . CBC with Differential/Platelet  . CMP14+EGFR  . Lipid panel    Caryl Pina, MD [facility] 05/03/2019, 9:26 PM

## 2019-05-06 ENCOUNTER — Encounter: Payer: Self-pay | Admitting: Family Medicine

## 2019-05-07 NOTE — Telephone Encounter (Signed)
I called and spoke with patient and he wanted to give a update on BP. Last night it was 154/84. He has not seen it below 140 on the top. He normally see's them between 140-150s on the top and 70-80s on the bottom. He just wanted to give you a update and he will continue to monitor.

## 2019-05-26 ENCOUNTER — Other Ambulatory Visit: Payer: Self-pay

## 2019-05-26 ENCOUNTER — Other Ambulatory Visit: Payer: BC Managed Care – PPO

## 2019-05-26 DIAGNOSIS — E118 Type 2 diabetes mellitus with unspecified complications: Secondary | ICD-10-CM | POA: Diagnosis not present

## 2019-05-26 DIAGNOSIS — E785 Hyperlipidemia, unspecified: Secondary | ICD-10-CM

## 2019-05-26 DIAGNOSIS — E039 Hypothyroidism, unspecified: Secondary | ICD-10-CM | POA: Diagnosis not present

## 2019-05-26 LAB — BAYER DCA HB A1C WAIVED: HB A1C (BAYER DCA - WAIVED): 5.4 % (ref <7.0)

## 2019-05-27 LAB — CMP14+EGFR
ALT: 53 IU/L — ABNORMAL HIGH (ref 0–44)
AST: 35 IU/L (ref 0–40)
Albumin/Globulin Ratio: 1.6 (ref 1.2–2.2)
Albumin: 4.4 g/dL (ref 4.0–5.0)
Alkaline Phosphatase: 112 IU/L (ref 39–117)
BUN/Creatinine Ratio: 16 (ref 9–20)
BUN: 11 mg/dL (ref 6–24)
Bilirubin Total: 0.5 mg/dL (ref 0.0–1.2)
CO2: 22 mmol/L (ref 20–29)
Calcium: 9.6 mg/dL (ref 8.7–10.2)
Chloride: 102 mmol/L (ref 96–106)
Creatinine, Ser: 0.68 mg/dL — ABNORMAL LOW (ref 0.76–1.27)
GFR calc Af Amer: 137 mL/min/{1.73_m2} (ref >59)
GFR calc non Af Amer: 119 mL/min/{1.73_m2} (ref >59)
Globulin, Total: 2.7 g/dL (ref 1.5–4.5)
Glucose: 77 mg/dL (ref 65–99)
Potassium: 4.1 mmol/L (ref 3.5–5.2)
Sodium: 141 mmol/L (ref 134–144)
Total Protein: 7.1 g/dL (ref 6.0–8.5)

## 2019-05-27 LAB — CBC WITH DIFFERENTIAL/PLATELET
Basophils Absolute: 0.1 10*3/uL (ref 0.0–0.2)
Basos: 1 %
EOS (ABSOLUTE): 0.3 10*3/uL (ref 0.0–0.4)
Eos: 4 %
Hematocrit: 46.5 % (ref 37.5–51.0)
Hemoglobin: 15.6 g/dL (ref 13.0–17.7)
Immature Grans (Abs): 0 10*3/uL (ref 0.0–0.1)
Immature Granulocytes: 0 %
Lymphocytes Absolute: 1.8 10*3/uL (ref 0.7–3.1)
Lymphs: 21 %
MCH: 27.1 pg (ref 26.6–33.0)
MCHC: 33.5 g/dL (ref 31.5–35.7)
MCV: 81 fL (ref 79–97)
Monocytes Absolute: 0.6 10*3/uL (ref 0.1–0.9)
Monocytes: 7 %
Neutrophils Absolute: 6.1 10*3/uL (ref 1.4–7.0)
Neutrophils: 67 %
Platelets: 323 10*3/uL (ref 150–450)
RBC: 5.75 x10E6/uL (ref 4.14–5.80)
RDW: 13.3 % (ref 11.6–15.4)
WBC: 8.9 10*3/uL (ref 3.4–10.8)

## 2019-05-27 LAB — LIPID PANEL
Chol/HDL Ratio: 6 ratio — ABNORMAL HIGH (ref 0.0–5.0)
Cholesterol, Total: 222 mg/dL — ABNORMAL HIGH (ref 100–199)
HDL: 37 mg/dL — ABNORMAL LOW (ref >39)
LDL Chol Calc (NIH): 151 mg/dL — ABNORMAL HIGH (ref 0–99)
Triglycerides: 184 mg/dL — ABNORMAL HIGH (ref 0–149)
VLDL Cholesterol Cal: 34 mg/dL (ref 5–40)

## 2019-06-01 ENCOUNTER — Telehealth: Payer: Self-pay | Admitting: Family Medicine

## 2019-06-01 NOTE — Telephone Encounter (Signed)
lmtcb

## 2019-06-01 NOTE — Telephone Encounter (Signed)
Okay sounds good, just make sure he takes it consistently.

## 2019-06-01 NOTE — Telephone Encounter (Signed)
Please advise on change of dose being requested.

## 2019-06-01 NOTE — Telephone Encounter (Signed)
Patient aware and verbalizes understanding. 

## 2019-06-10 ENCOUNTER — Other Ambulatory Visit: Payer: Self-pay

## 2019-06-11 ENCOUNTER — Encounter: Payer: Self-pay | Admitting: Family Medicine

## 2019-06-11 ENCOUNTER — Ambulatory Visit (INDEPENDENT_AMBULATORY_CARE_PROVIDER_SITE_OTHER): Payer: BC Managed Care – PPO | Admitting: Family Medicine

## 2019-06-11 VITALS — BP 151/83 | HR 71 | Temp 98.4°F | Wt 354.2 lb

## 2019-06-11 DIAGNOSIS — Z0000 Encounter for general adult medical examination without abnormal findings: Secondary | ICD-10-CM

## 2019-06-11 DIAGNOSIS — E785 Hyperlipidemia, unspecified: Secondary | ICD-10-CM

## 2019-06-11 DIAGNOSIS — I10 Essential (primary) hypertension: Secondary | ICD-10-CM

## 2019-06-11 DIAGNOSIS — E039 Hypothyroidism, unspecified: Secondary | ICD-10-CM | POA: Diagnosis not present

## 2019-06-11 DIAGNOSIS — Z0001 Encounter for general adult medical examination with abnormal findings: Secondary | ICD-10-CM

## 2019-06-11 MED ORDER — AMLODIPINE BESYLATE 5 MG PO TABS: 5.0000 mg | ORAL_TABLET | Freq: Every day | ORAL | 3 refills | Status: DC

## 2019-06-11 NOTE — Progress Notes (Signed)
BP (!) 151/83   Pulse 71   Temp 98.4 F (36.9 C) (Temporal)   Wt (!) 354 lb 3.2 oz (160.7 kg)   SpO2 97%   BMI 44.27 kg/m    Subjective:   Patient ID: Jay Lang, male    DOB: 05/06/1976, 43 y.o.   MRN: [id number]  HPI: Kajuan Lang is a 43 y.o. male presenting on 06/11/2019 for Annual Exam   HPI Adult well exam and physical and recheck blood pressure and cholesterol. Patient's blood work that he did just a few weeks ago showed that his cholesterol was elevated and he has since increased his atorvastatin and feels like it is going okay.  His liver numbers have been improving when he had been trying to lose weight.  He is going to continue with that.  He started walking.  His blood pressure has been up consistently at home in the 1 40-1 50 range and he has no blood pressures below that.  His blood pressure today in the office is 158/94 with a heart rate of 65.  Hypothyroidism recheck Patient is coming in for thyroid recheck today as well. They deny any issues with hair changes or heat or cold problems or diarrhea or constipation. They deny any chest pain or palpitations. They are currently on levothyroxine 144mcrograms   Hyperlipidemia Patient is coming in for recheck of his hyperlipidemia. The patient is currently taking Lipitor. They deny any issues with myalgias or history of liver damage from it. They deny any focal numbness or weakness or chest pain.   Hypertension Patient is currently on medication currently but will have to start 1, and their blood pressure today is 158/94. Patient denies any lightheadedness or dizziness. Patient denies headaches, blurred vision, chest pains, shortness of breath, or weakness. Denies any side effects from medication and is content with current medication.   Relevant past medical, surgical, family and social history reviewed and updated as indicated. Interim medical history since our last visit reviewed. Allergies and medications reviewed and  updated.  Review of Systems  Constitutional: Negative for chills and fever.  Eyes: Negative for discharge.  Respiratory: Negative for shortness of breath and wheezing.   Cardiovascular: Negative for chest pain and leg swelling.  Musculoskeletal: Negative for back pain and gait problem.  Skin: Negative for rash.  Neurological: Negative for dizziness, weakness, light-headedness and numbness.  All other systems reviewed and are negative.   Per HPI unless specifically indicated above   Allergies as of 06/11/2019      Reactions   Penicillins    Rash      Medication List       Accurate as of June 11, 2019 11:16 AM. If you have any questions, ask your nurse or doctor.        amLODipine 5 MG tablet Commonly known as: NORVASC Take 1 tablet (5 mg total) by mouth daily. Started by: JFransisca KaufmannDettinger, MD   atorvastatin 20 MG tablet Commonly known as: LIPITOR TAKE 1 TABLET BY MOUTH EVERY DAY   levothyroxine 137 MCG tablet Commonly known as: SYNTHROID Take 2 tablets by mouth daily.   NexIUM 40 MG capsule Generic drug: esomeprazole TAKE ONE CAPSULE BY MOUTH EVERY DAY        Objective:   BP (!) 151/83   Pulse 71   Temp 98.4 F (36.9 C) (Temporal)   Wt (!) 354 lb 3.2 oz (160.7 kg)   SpO2 97%   BMI 44.27 kg/m   Wt Readings  from Last 3 Encounters:  06/11/19 (!) 354 lb 3.2 oz (160.7 kg)  04/27/19 (!) 354 lb 6.4 oz (160.8 kg)  03/25/19 (!) 355 lb (161 kg)    Physical Exam Vitals and nursing note reviewed.  Constitutional:      General: He is not in acute distress.    Appearance: He is well-developed. He is not diaphoretic.  Eyes:     General: No scleral icterus.    Conjunctiva/sclera: Conjunctivae normal.  Neck:     Thyroid: No thyromegaly.  Cardiovascular:     Rate and Rhythm: Normal rate and regular rhythm.     Heart sounds: Normal heart sounds. No murmur.  Pulmonary:     Effort: Pulmonary effort is normal. No respiratory distress.     Breath sounds:  Normal breath sounds. No wheezing.  Musculoskeletal:        General: Normal range of motion.     Cervical back: Neck supple.  Lymphadenopathy:     Cervical: No cervical adenopathy.  Skin:    General: Skin is warm and dry.     Findings: No rash.  Neurological:     Mental Status: He is alert and oriented to person, place, and time.     Coordination: Coordination normal.  Psychiatric:        Behavior: Behavior normal.     Results for orders placed or performed in visit on 05/26/19  Lipid panel  Result Value Ref Range   Cholesterol, Total 222 (H) 100 - 199 mg/dL   Triglycerides 184 (H) 0 - 149 mg/dL   HDL 37 (L) >39 mg/dL   VLDL Cholesterol Cal 34 5 - 40 mg/dL   LDL Chol Calc (NIH) 151 (H) 0 - 99 mg/dL   Chol/HDL Ratio 6.0 (H) 0.0 - 5.0 ratio  CMP14+EGFR  Result Value Ref Range   Glucose 77 65 - 99 mg/dL   BUN 11 6 - 24 mg/dL   Creatinine, Ser 0.68 (L) 0.76 - 1.27 mg/dL   GFR calc non Af Amer 119 >59 mL/min/1.73   GFR calc Af Amer 137 >59 mL/min/1.73   BUN/Creatinine Ratio 16 9 - 20   Sodium 141 134 - 144 mmol/L   Potassium 4.1 3.5 - 5.2 mmol/L   Chloride 102 96 - 106 mmol/L   CO2 22 20 - 29 mmol/L   Calcium 9.6 8.7 - 10.2 mg/dL   Total Protein 7.1 6.0 - 8.5 g/dL   Albumin 4.4 4.0 - 5.0 g/dL   Globulin, Total 2.7 1.5 - 4.5 g/dL   Albumin/Globulin Ratio 1.6 1.2 - 2.2   Bilirubin Total 0.5 0.0 - 1.2 mg/dL   Alkaline Phosphatase 112 39 - 117 IU/L   AST 35 0 - 40 IU/L   ALT 53 (H) 0 - 44 IU/L  CBC with Differential/Platelet  Result Value Ref Range   WBC 8.9 3.4 - 10.8 x10E3/uL   RBC 5.75 4.14 - 5.80 x10E6/uL   Hemoglobin 15.6 13.0 - 17.7 g/dL   Hematocrit 46.5 37.5 - 51.0 %   MCV 81 79 - 97 fL   MCH 27.1 26.6 - 33.0 pg   MCHC 33.5 31.5 - 35.7 g/dL   RDW 13.3 11.6 - 15.4 %   Platelets 323 150 - 450 x10E3/uL   Neutrophils 67 Not Estab. %   Lymphs 21 Not Estab. %   Monocytes 7 Not Estab. %   Eos 4 Not Estab. %   Basos 1 Not Estab. %   Neutrophils Absolute 6.1 1.4 -  7.0 x10E3/uL   Lymphocytes Absolute 1.8 0.7 - 3.1 x10E3/uL   Monocytes Absolute 0.6 0.1 - 0.9 x10E3/uL   EOS (ABSOLUTE) 0.3 0.0 - 0.4 x10E3/uL   Basophils Absolute 0.1 0.0 - 0.2 x10E3/uL   Immature Granulocytes 0 Not Estab. %   Immature Grans (Abs) 0.0 0.0 - 0.1 x10E3/uL  Bayer DCA Hb A1c Waived  Result Value Ref Range   HB A1C (BAYER DCA - WAIVED) 5.4 <7.0 %    Assessment & Plan:   Problem List Items Addressed This Visit      Endocrine   Hypothyroid     Other   Dyslipidemia   Morbid obesity ([place])    Other Visit Diagnoses    Well adult exam    -  Primary   Essential hypertension       Relevant Medications   amLODipine (NORVASC) 5 MG tablet      Continue current levels and dosing of levothyroxine and atorvastatin.  Will start amlodipine for blood pressure Follow up plan: Return in about 6 months (around 12/10/2019), or if symptoms worsen or fail to improve, for Hypertension and cholesterol.  Counseling provided for all of the vaccine components No orders of the defined types were placed in this encounter.   Caryl Pina, MD [facility] 06/11/2019, 11:16 AM

## 2019-07-19 ENCOUNTER — Other Ambulatory Visit: Payer: Self-pay | Admitting: Family Medicine

## 2019-08-07 ENCOUNTER — Encounter: Payer: Self-pay | Admitting: Family Medicine

## 2019-09-16 DIAGNOSIS — Z23 Encounter for immunization: Secondary | ICD-10-CM | POA: Diagnosis not present

## 2019-10-14 DIAGNOSIS — Z23 Encounter for immunization: Secondary | ICD-10-CM | POA: Diagnosis not present

## 2019-10-16 ENCOUNTER — Other Ambulatory Visit: Payer: Self-pay | Admitting: Family Medicine

## 2019-10-20 ENCOUNTER — Other Ambulatory Visit: Payer: Self-pay | Admitting: Family Medicine

## 2019-10-21 ENCOUNTER — Other Ambulatory Visit: Payer: Self-pay | Admitting: *Deleted

## 2019-10-21 ENCOUNTER — Telehealth: Payer: Self-pay | Admitting: Family Medicine

## 2019-10-21 DIAGNOSIS — E7800 Pure hypercholesterolemia, unspecified: Secondary | ICD-10-CM | POA: Diagnosis not present

## 2019-10-21 DIAGNOSIS — E782 Mixed hyperlipidemia: Secondary | ICD-10-CM

## 2019-10-21 DIAGNOSIS — R7301 Impaired fasting glucose: Secondary | ICD-10-CM | POA: Diagnosis not present

## 2019-10-21 DIAGNOSIS — C73 Malignant neoplasm of thyroid gland: Secondary | ICD-10-CM | POA: Diagnosis not present

## 2019-10-21 DIAGNOSIS — E890 Postprocedural hypothyroidism: Secondary | ICD-10-CM | POA: Diagnosis not present

## 2019-10-21 DIAGNOSIS — Z85850 Personal history of malignant neoplasm of thyroid: Secondary | ICD-10-CM

## 2019-10-21 DIAGNOSIS — I10 Essential (primary) hypertension: Secondary | ICD-10-CM

## 2019-10-21 NOTE — Telephone Encounter (Signed)
Labs ordered.

## 2019-10-21 NOTE — Telephone Encounter (Signed)
Future labs ordered : PSA, CBC, CMP,Lipid, Urine.   History of thyroid cancer and an A1C was ordered before but I do not see Diabetes on problem list. Please add other labs provider may need. Thanks

## 2019-10-21 NOTE — Telephone Encounter (Signed)
Yes those labs sound good, okay to go ahead and put in, thanks

## 2019-10-21 NOTE — Telephone Encounter (Signed)
Aware per message left on voice mail, labs ordered.

## 2019-10-22 ENCOUNTER — Other Ambulatory Visit: Payer: Self-pay

## 2019-10-22 ENCOUNTER — Other Ambulatory Visit: Payer: BC Managed Care – PPO

## 2019-10-22 DIAGNOSIS — E782 Mixed hyperlipidemia: Secondary | ICD-10-CM | POA: Diagnosis not present

## 2019-10-22 DIAGNOSIS — I10 Essential (primary) hypertension: Secondary | ICD-10-CM | POA: Diagnosis not present

## 2019-10-22 LAB — URINALYSIS, COMPLETE
Bilirubin, UA: NEGATIVE
Glucose, UA: NEGATIVE
Ketones, UA: NEGATIVE
Leukocytes,UA: NEGATIVE
Nitrite, UA: NEGATIVE
Specific Gravity, UA: 1.02 (ref 1.005–1.030)
Urobilinogen, Ur: 0.2 mg/dL (ref 0.2–1.0)
pH, UA: 6.5 (ref 5.0–7.5)

## 2019-10-22 LAB — MICROSCOPIC EXAMINATION
Bacteria, UA: NONE SEEN
Epithelial Cells (non renal): NONE SEEN /hpf (ref 0–10)
RBC, Urine: NONE SEEN /hpf (ref 0–2)
Renal Epithel, UA: NONE SEEN /hpf
WBC, UA: NONE SEEN /hpf (ref 0–5)

## 2019-10-23 LAB — CBC WITH DIFFERENTIAL/PLATELET
Basophils Absolute: 0.1 10*3/uL (ref 0.0–0.2)
Basos: 1 %
EOS (ABSOLUTE): 0.4 10*3/uL (ref 0.0–0.4)
Eos: 6 %
Hematocrit: 46.6 % (ref 37.5–51.0)
Hemoglobin: 15.7 g/dL (ref 13.0–17.7)
Immature Grans (Abs): 0 10*3/uL (ref 0.0–0.1)
Immature Granulocytes: 0 %
Lymphocytes Absolute: 1.7 10*3/uL (ref 0.7–3.1)
Lymphs: 21 %
MCH: 27.4 pg (ref 26.6–33.0)
MCHC: 33.7 g/dL (ref 31.5–35.7)
MCV: 81 fL (ref 79–97)
Monocytes Absolute: 0.6 10*3/uL (ref 0.1–0.9)
Monocytes: 7 %
Neutrophils Absolute: 5.1 10*3/uL (ref 1.4–7.0)
Neutrophils: 65 %
Platelets: 335 10*3/uL (ref 150–450)
RBC: 5.73 x10E6/uL (ref 4.14–5.80)
RDW: 13.1 % (ref 11.6–15.4)
WBC: 7.8 10*3/uL (ref 3.4–10.8)

## 2019-10-23 LAB — LIPID PANEL
Chol/HDL Ratio: 4.4 ratio (ref 0.0–5.0)
Cholesterol, Total: 157 mg/dL (ref 100–199)
HDL: 36 mg/dL — ABNORMAL LOW (ref >39)
LDL Chol Calc (NIH): 98 mg/dL (ref 0–99)
Triglycerides: 126 mg/dL (ref 0–149)
VLDL Cholesterol Cal: 23 mg/dL (ref 5–40)

## 2019-10-23 LAB — PSA, TOTAL AND FREE
PSA, Free Pct: 17 %
PSA, Free: 0.17 ng/mL
Prostate Specific Ag, Serum: 1 ng/mL (ref 0.0–4.0)

## 2019-10-23 LAB — CMP14+EGFR
ALT: 62 IU/L — ABNORMAL HIGH (ref 0–44)
AST: 32 IU/L (ref 0–40)
Albumin/Globulin Ratio: 1.8 (ref 1.2–2.2)
Albumin: 4.5 g/dL (ref 4.0–5.0)
Alkaline Phosphatase: 140 IU/L — ABNORMAL HIGH (ref 39–117)
BUN/Creatinine Ratio: 14 (ref 9–20)
BUN: 11 mg/dL (ref 6–24)
Bilirubin Total: 0.4 mg/dL (ref 0.0–1.2)
CO2: 26 mmol/L (ref 20–29)
Calcium: 9.7 mg/dL (ref 8.7–10.2)
Chloride: 99 mmol/L (ref 96–106)
Creatinine, Ser: 0.81 mg/dL (ref 0.76–1.27)
GFR calc Af Amer: 127 mL/min/{1.73_m2} (ref >59)
GFR calc non Af Amer: 110 mL/min/{1.73_m2} (ref >59)
Globulin, Total: 2.5 g/dL (ref 1.5–4.5)
Glucose: 85 mg/dL (ref 65–99)
Potassium: 4.8 mmol/L (ref 3.5–5.2)
Sodium: 137 mmol/L (ref 134–144)
Total Protein: 7 g/dL (ref 6.0–8.5)

## 2019-10-28 ENCOUNTER — Encounter: Payer: Self-pay | Admitting: Family Medicine

## 2019-10-28 ENCOUNTER — Ambulatory Visit (INDEPENDENT_AMBULATORY_CARE_PROVIDER_SITE_OTHER): Payer: BC Managed Care – PPO | Admitting: Family Medicine

## 2019-10-28 DIAGNOSIS — E039 Hypothyroidism, unspecified: Secondary | ICD-10-CM

## 2019-10-28 DIAGNOSIS — E785 Hyperlipidemia, unspecified: Secondary | ICD-10-CM

## 2019-10-28 DIAGNOSIS — C73 Malignant neoplasm of thyroid gland: Secondary | ICD-10-CM | POA: Diagnosis not present

## 2019-10-28 DIAGNOSIS — E890 Postprocedural hypothyroidism: Secondary | ICD-10-CM | POA: Diagnosis not present

## 2019-10-28 DIAGNOSIS — I10 Essential (primary) hypertension: Secondary | ICD-10-CM | POA: Insufficient documentation

## 2019-10-28 DIAGNOSIS — E7800 Pure hypercholesterolemia, unspecified: Secondary | ICD-10-CM | POA: Diagnosis not present

## 2019-10-28 DIAGNOSIS — R7301 Impaired fasting glucose: Secondary | ICD-10-CM | POA: Diagnosis not present

## 2019-10-28 NOTE — Progress Notes (Signed)
Virtual Visit via telephone Note  I connected with Jay Lang on 10/28/19 at [place] by telephone and verified that I am speaking with the correct person using two identifiers. Jay Lang is currently located at home and no other people are currently with her during visit. The provider, Fransisca Kaufmann Talayla Doyel, MD is located in their office at time of visit.  Call ended at [phone]  I discussed the limitations, risks, security and privacy concerns of performing an evaluation and management service by telephone and the availability of in person appointments. I also discussed with the patient that there may be a patient responsible charge related to this service. The patient expressed understanding and agreed to proceed.   History and Present Illness: Hypertension Patient is currently on amlodipine, and their blood pressure today is unknown. Patient denies any lightheadedness or dizziness. Patient denies headaches, blurred vision, chest pains, shortness of breath, or weakness. Denies any side effects from medication and is content with current medication.   Hyperlipidemia Patient is coming in for recheck of his hyperlipidemia. The patient is currently taking atorvastatin every day and has been doing diet. They deny any issues with myalgias or history of liver damage from it. They deny any focal numbness or weakness or chest pain.   Hypothyroidism recheck Patient is coming in for thyroid recheck today as well. They deny any issues with hair changes or heat or cold problems or diarrhea or constipation. They deny any chest pain or palpitations. They are currently on levothyroxine 185micrograms   GERD Patient is currently on nexium.  She denies any major symptoms or abdominal pain or belching or burping. She denies any blood in her stool or lightheadedness or dizziness.   Outpatient Encounter Medications as of 10/28/2019  Medication Sig  . amLODipine (NORVASC) 5 MG tablet Take 1 tablet (5 mg total) by mouth  daily.  Marland Kitchen atorvastatin (LIPITOR) 20 MG tablet TAKE 1 TABLET BY MOUTH EVERY DAY  . levothyroxine (SYNTHROID, LEVOTHROID) 137 MCG tablet Take 2 tablets by mouth daily.  Marland Kitchen NEXIUM 40 MG capsule TAKE ONE CAPSULE BY MOUTH EVERY DAY   No facility-administered encounter medications on file as of 10/28/2019.    Review of Systems  Constitutional: Negative for chills and fever.  Respiratory: Negative for shortness of breath and wheezing.   Cardiovascular: Negative for chest pain and leg swelling.  Musculoskeletal: Negative for back pain and gait problem.  Skin: Negative for rash.  Neurological: Negative for dizziness, weakness and light-headedness.  All other systems reviewed and are negative.   Observations/Objective: Patient sounds comfortable and in no acute distress  Assessment and Plan: Problem List Items Addressed This Visit      Cardiovascular and Mediastinum   Hypertension     Endocrine   Hypothyroid - Primary     Other   Dyslipidemia   Morbid obesity ([place])      Continue current medication  Follow up plan: Return in about 6 months (around 04/28/2020), or if symptoms worsen or fail to improve, for htn and hypothyroid and hld.     I discussed the assessment and treatment plan with the patient. The patient was provided an opportunity to ask questions and all were answered. The patient agreed with the plan and demonstrated an understanding of the instructions.   The patient was advised to call back or seek an in-person evaluation if the symptoms worsen or if the condition fails to improve as anticipated.  The above assessment and management plan was discussed with the  patient. The patient verbalized understanding of and has agreed to the management plan. Patient is aware to call the clinic if symptoms persist or worsen. Patient is aware when to return to the clinic for a follow-up visit. Patient educated on when it is appropriate to go to the emergency department.    I  provided 10 minutes of non-face-to-face time during this encounter.    Worthy Rancher, MD

## 2019-10-29 ENCOUNTER — Encounter: Payer: Self-pay | Admitting: Family Medicine

## 2019-12-10 ENCOUNTER — Other Ambulatory Visit: Payer: Self-pay | Admitting: Family Medicine

## 2020-03-06 ENCOUNTER — Other Ambulatory Visit: Payer: Self-pay | Admitting: Family Medicine

## 2020-04-08 ENCOUNTER — Other Ambulatory Visit: Payer: Self-pay | Admitting: Family Medicine

## 2020-05-04 ENCOUNTER — Other Ambulatory Visit: Payer: Self-pay | Admitting: Family Medicine

## 2020-05-04 NOTE — Telephone Encounter (Signed)
Attempted to contact - Na

## 2020-05-04 NOTE — Telephone Encounter (Signed)
Dettinger. NTBS 30 days given 04/08/20

## 2020-06-08 ENCOUNTER — Other Ambulatory Visit: Payer: Self-pay | Admitting: Family Medicine

## 2020-06-08 DIAGNOSIS — I10 Essential (primary) hypertension: Secondary | ICD-10-CM

## 2020-06-15 ENCOUNTER — Encounter: Payer: BC Managed Care – PPO | Admitting: Family Medicine

## 2020-06-21 DIAGNOSIS — Z136 Encounter for screening for cardiovascular disorders: Secondary | ICD-10-CM | POA: Diagnosis not present

## 2020-06-21 DIAGNOSIS — Z114 Encounter for screening for human immunodeficiency virus [HIV]: Secondary | ICD-10-CM | POA: Diagnosis not present

## 2020-06-21 DIAGNOSIS — E559 Vitamin D deficiency, unspecified: Secondary | ICD-10-CM | POA: Diagnosis not present

## 2020-06-21 DIAGNOSIS — Z131 Encounter for screening for diabetes mellitus: Secondary | ICD-10-CM | POA: Diagnosis not present

## 2020-06-21 DIAGNOSIS — Z0000 Encounter for general adult medical examination without abnormal findings: Secondary | ICD-10-CM | POA: Diagnosis not present

## 2020-06-21 DIAGNOSIS — Z1159 Encounter for screening for other viral diseases: Secondary | ICD-10-CM | POA: Diagnosis not present

## 2020-06-21 DIAGNOSIS — E039 Hypothyroidism, unspecified: Secondary | ICD-10-CM | POA: Diagnosis not present

## 2020-06-29 DIAGNOSIS — Z23 Encounter for immunization: Secondary | ICD-10-CM | POA: Diagnosis not present

## 2020-07-06 DIAGNOSIS — R7401 Elevation of levels of liver transaminase levels: Secondary | ICD-10-CM | POA: Diagnosis not present

## 2020-07-06 DIAGNOSIS — E039 Hypothyroidism, unspecified: Secondary | ICD-10-CM | POA: Diagnosis not present

## 2020-07-06 DIAGNOSIS — E559 Vitamin D deficiency, unspecified: Secondary | ICD-10-CM | POA: Diagnosis not present

## 2020-07-06 DIAGNOSIS — I10 Essential (primary) hypertension: Secondary | ICD-10-CM | POA: Diagnosis not present

## 2020-07-12 ENCOUNTER — Other Ambulatory Visit: Payer: Self-pay

## 2020-07-12 DIAGNOSIS — E782 Mixed hyperlipidemia: Secondary | ICD-10-CM

## 2020-07-12 DIAGNOSIS — I10 Essential (primary) hypertension: Secondary | ICD-10-CM

## 2020-07-12 DIAGNOSIS — E039 Hypothyroidism, unspecified: Secondary | ICD-10-CM

## 2020-07-12 DIAGNOSIS — E785 Hyperlipidemia, unspecified: Secondary | ICD-10-CM

## 2020-07-12 DIAGNOSIS — E118 Type 2 diabetes mellitus with unspecified complications: Secondary | ICD-10-CM

## 2020-07-13 ENCOUNTER — Encounter: Payer: Self-pay | Admitting: Family Medicine

## 2020-07-13 ENCOUNTER — Other Ambulatory Visit: Payer: Self-pay

## 2020-07-13 ENCOUNTER — Ambulatory Visit (INDEPENDENT_AMBULATORY_CARE_PROVIDER_SITE_OTHER): Payer: BC Managed Care – PPO | Admitting: Family Medicine

## 2020-07-13 VITALS — BP 155/76 | HR 65 | Ht 75.0 in | Wt 358.0 lb

## 2020-07-13 DIAGNOSIS — Z23 Encounter for immunization: Secondary | ICD-10-CM | POA: Diagnosis not present

## 2020-07-13 DIAGNOSIS — Z85850 Personal history of malignant neoplasm of thyroid: Secondary | ICD-10-CM | POA: Diagnosis not present

## 2020-07-13 DIAGNOSIS — E039 Hypothyroidism, unspecified: Secondary | ICD-10-CM | POA: Diagnosis not present

## 2020-07-13 DIAGNOSIS — I10 Essential (primary) hypertension: Secondary | ICD-10-CM

## 2020-07-13 DIAGNOSIS — E785 Hyperlipidemia, unspecified: Secondary | ICD-10-CM | POA: Diagnosis not present

## 2020-07-13 MED ORDER — ESOMEPRAZOLE MAGNESIUM 40 MG PO CPDR
40.0000 mg | DELAYED_RELEASE_CAPSULE | Freq: Every day | ORAL | 3 refills | Status: DC
Start: 2020-07-13 — End: 2021-11-08

## 2020-07-13 MED ORDER — AMLODIPINE BESYLATE 5 MG PO TABS: 5.0000 mg | ORAL_TABLET | Freq: Every day | ORAL | 3 refills | Status: DC

## 2020-07-13 MED ORDER — ATORVASTATIN CALCIUM 20 MG PO TABS
20.0000 mg | ORAL_TABLET | Freq: Every day | ORAL | 3 refills | Status: DC
Start: 2020-07-13 — End: 2021-08-28

## 2020-07-13 MED ORDER — LEVOTHYROXINE SODIUM 137 MCG PO TABS
274.0000 ug | ORAL_TABLET | Freq: Every day | ORAL | 3 refills | Status: DC
Start: 2020-07-13 — End: 2021-07-26

## 2020-07-13 NOTE — Progress Notes (Signed)
BP (!) 155/76   Pulse 65   Ht _0  (1.905 m)   Wt (!) 358 lb (162.4 kg)   SpO2 95%   BMI 44.75 kg/m    Subjective:   Patient ID: Jay Lang, male    DOB: 02-03-1976, 45 y.o.   MRN: [id number]  HPI: Jay Lang is a 45 y.o. male presenting on 07/13/2020 for Medical Management of Chronic Issues, Hypothyroidism, and Hypertension   HPI Hypothyroidism recheck Patient is coming in for thyroid recheck today as well. They deny any issues with hair changes or heat or cold problems or diarrhea or constipation. They deny any chest pain or palpitations. They are currently on levothyroxine 258mg  Hypertension Patient is currently on amlodipine, and their blood pressure today is 155/76 but on retake it was 139/83. Patient denies any lightheadedness or dizziness. Patient denies headaches, blurred vision, chest pains, shortness of breath, or weakness. Denies any side effects from medication and is content with current medication.   Hyperlipidemia Patient is coming in for recheck of his hyperlipidemia. The patient is currently taking Lipitor. They deny any issues with myalgias or history of liver damage from it. They deny any focal numbness or weakness or chest pain.   GERD Patient is currently on Nexium.  She denies any major symptoms or abdominal pain or belching or burping. She denies any blood in her stool or lightheadedness or dizziness.  Relevant past medical, surgical, family and social history reviewed and updated as indicated. Interim medical history since our last visit reviewed. Allergies and medications reviewed and updated.  Review of Systems  Constitutional: Negative for chills and fever.  Eyes: Negative for visual disturbance.  Respiratory: Negative for shortness of breath and wheezing.   Cardiovascular: Negative for chest pain and leg swelling.  Musculoskeletal: Negative for back pain and gait problem.  Skin: Negative for rash.  Neurological: Negative for dizziness, weakness  and numbness.  All other systems reviewed and are negative.   Per HPI unless specifically indicated above   Allergies as of 07/13/2020      Reactions   Penicillins    Rash      Medication List       Accurate as of July 13, 2020 12:13 PM. If you have any questions, ask your nurse or doctor.        amLODipine 5 MG tablet Commonly known as: NORVASC Take 1 tablet (5 mg total) by mouth daily.   atorvastatin 20 MG tablet Commonly known as: LIPITOR Take 1 tablet (20 mg total) by mouth daily. (Needs to be seen before next refill)   esomeprazole 40 MG capsule Commonly known as: NexIUM Take 1 capsule (40 mg total) by mouth daily. What changed: how much to take Changed by: JWorthy Rancher MD   levothyroxine 137 MCG tablet Commonly known as: SYNTHROID Take 2 tablets (274 mcg total) by mouth daily.        Objective:   BP (!) 155/76   Pulse 65   Ht _1  (1.905 m)   Wt (!) 358 lb (162.4 kg)   SpO2 95%   BMI 44.75 kg/m   Wt Readings from Last 3 Encounters:  07/13/20 (!) 358 lb (162.4 kg)  06/11/19 (!) 354 lb 3.2 oz (160.7 kg)  04/27/19 (!) 354 lb 6.4 oz (160.8 kg)    Physical Exam Vitals and nursing note reviewed.  Constitutional:      General: He is not in acute distress.    Appearance: He is well-developed  and well-nourished. He is not diaphoretic.  Eyes:     General: No scleral icterus.    Extraocular Movements: EOM normal.     Conjunctiva/sclera: Conjunctivae normal.  Neck:     Thyroid: No thyromegaly.  Cardiovascular:     Rate and Rhythm: Normal rate and regular rhythm.     Pulses: Intact distal pulses.     Heart sounds: Normal heart sounds. No murmur heard.   Pulmonary:     Effort: Pulmonary effort is normal. No respiratory distress.     Breath sounds: Normal breath sounds. No wheezing.  Musculoskeletal:        General: No edema. Normal range of motion.     Cervical back: Neck supple.  Lymphadenopathy:     Cervical: No cervical adenopathy.   Skin:    General: Skin is warm and dry.     Findings: No rash.  Neurological:     Mental Status: He is alert and oriented to person, place, and time.     Coordination: Coordination normal.  Psychiatric:        Mood and Affect: Mood and affect normal.        Behavior: Behavior normal.       Assessment & Plan:   Problem List Items Addressed This Visit      Cardiovascular and Mediastinum   Hypertension   Relevant Medications   amLODipine (NORVASC) 5 MG tablet   atorvastatin (LIPITOR) 20 MG tablet   Other Relevant Orders   CMP14+EGFR     Endocrine   Hypothyroid - Primary   Relevant Medications   levothyroxine (SYNTHROID) 137 MCG tablet   Other Relevant Orders   TSH     Other   History of thyroid cancer   Dyslipidemia   Relevant Medications   atorvastatin (LIPITOR) 20 MG tablet   Other Relevant Orders   Lipid panel    Other Visit Diagnoses    Essential hypertension       Relevant Medications   amLODipine (NORVASC) 5 MG tablet   atorvastatin (LIPITOR) 20 MG tablet   Other Relevant Orders   CBC with Differential/Platelet      Continue current medication, we did discuss sleep apnea some and he is declining doing the study right now but will consider it in the future and discuss it more with his wife.  Will check blood work. Follow up plan: Return in about 6 months (around 01/10/2021), or if symptoms worsen or fail to improve, for hypothyroid and htn and hld.  Counseling provided for all of the vaccine components Orders Placed This Encounter  Procedures  . CBC with Differential/Platelet  . CMP14+EGFR  . Lipid panel  . TSH    Caryl Pina, MD [facility] 07/13/2020, 12:13 PM

## 2020-07-14 LAB — CBC WITH DIFFERENTIAL/PLATELET
Basophils Absolute: 0.1 10*3/uL (ref 0.0–0.2)
Basos: 1 %
EOS (ABSOLUTE): 0.4 10*3/uL (ref 0.0–0.4)
Eos: 4 %
Hematocrit: 50.8 % (ref 37.5–51.0)
Hemoglobin: 16.6 g/dL (ref 13.0–17.7)
Immature Grans (Abs): 0 10*3/uL (ref 0.0–0.1)
Immature Granulocytes: 0 %
Lymphocytes Absolute: 1.6 10*3/uL (ref 0.7–3.1)
Lymphs: 15 %
MCH: 26.8 pg (ref 26.6–33.0)
MCHC: 32.7 g/dL (ref 31.5–35.7)
MCV: 82 fL (ref 79–97)
Monocytes Absolute: 0.6 10*3/uL (ref 0.1–0.9)
Monocytes: 6 %
Neutrophils Absolute: 8.1 10*3/uL — ABNORMAL HIGH (ref 1.4–7.0)
Neutrophils: 74 %
Platelets: 334 10*3/uL (ref 150–450)
RBC: 6.19 x10E6/uL — ABNORMAL HIGH (ref 4.14–5.80)
RDW: 13.1 % (ref 11.6–15.4)
WBC: 10.8 10*3/uL (ref 3.4–10.8)

## 2020-07-14 LAB — CMP14+EGFR
ALT: 56 IU/L — ABNORMAL HIGH (ref 0–44)
AST: 37 IU/L (ref 0–40)
Albumin/Globulin Ratio: 2.1 (ref 1.2–2.2)
Albumin: 4.7 g/dL (ref 4.0–5.0)
Alkaline Phosphatase: 124 IU/L — ABNORMAL HIGH (ref 44–121)
BUN/Creatinine Ratio: 12 (ref 9–20)
BUN: 8 mg/dL (ref 6–24)
Bilirubin Total: 0.5 mg/dL (ref 0.0–1.2)
CO2: 22 mmol/L (ref 20–29)
Calcium: 9.7 mg/dL (ref 8.7–10.2)
Chloride: 102 mmol/L (ref 96–106)
Creatinine, Ser: 0.65 mg/dL — ABNORMAL LOW (ref 0.76–1.27)
GFR calc Af Amer: 139 mL/min/{1.73_m2} (ref >59)
GFR calc non Af Amer: 120 mL/min/{1.73_m2} (ref >59)
Globulin, Total: 2.2 g/dL (ref 1.5–4.5)
Glucose: 72 mg/dL (ref 65–99)
Potassium: 4.6 mmol/L (ref 3.5–5.2)
Sodium: 141 mmol/L (ref 134–144)
Total Protein: 6.9 g/dL (ref 6.0–8.5)

## 2020-07-14 LAB — LIPID PANEL
Chol/HDL Ratio: 4 ratio (ref 0.0–5.0)
Cholesterol, Total: 147 mg/dL (ref 100–199)
HDL: 37 mg/dL — ABNORMAL LOW (ref >39)
LDL Chol Calc (NIH): 84 mg/dL (ref 0–99)
Triglycerides: 147 mg/dL (ref 0–149)
VLDL Cholesterol Cal: 26 mg/dL (ref 5–40)

## 2020-07-14 LAB — TSH: TSH: 0.383 u[IU]/mL — ABNORMAL LOW (ref 0.450–4.500)

## 2020-10-20 ENCOUNTER — Other Ambulatory Visit: Payer: Self-pay

## 2020-10-20 ENCOUNTER — Other Ambulatory Visit: Payer: BC Managed Care – PPO

## 2020-10-20 DIAGNOSIS — E890 Postprocedural hypothyroidism: Secondary | ICD-10-CM | POA: Diagnosis not present

## 2020-10-20 DIAGNOSIS — E7800 Pure hypercholesterolemia, unspecified: Secondary | ICD-10-CM | POA: Diagnosis not present

## 2020-10-20 DIAGNOSIS — C73 Malignant neoplasm of thyroid gland: Secondary | ICD-10-CM | POA: Diagnosis not present

## 2020-10-20 DIAGNOSIS — R7301 Impaired fasting glucose: Secondary | ICD-10-CM | POA: Diagnosis not present

## 2020-10-25 ENCOUNTER — Encounter: Payer: Self-pay | Admitting: Cardiology

## 2020-10-26 DIAGNOSIS — R7301 Impaired fasting glucose: Secondary | ICD-10-CM | POA: Diagnosis not present

## 2020-10-26 DIAGNOSIS — E890 Postprocedural hypothyroidism: Secondary | ICD-10-CM | POA: Diagnosis not present

## 2020-10-26 DIAGNOSIS — C73 Malignant neoplasm of thyroid gland: Secondary | ICD-10-CM | POA: Diagnosis not present

## 2020-10-26 DIAGNOSIS — E7800 Pure hypercholesterolemia, unspecified: Secondary | ICD-10-CM | POA: Diagnosis not present

## 2021-01-11 ENCOUNTER — Other Ambulatory Visit: Payer: Self-pay

## 2021-01-11 ENCOUNTER — Ambulatory Visit (INDEPENDENT_AMBULATORY_CARE_PROVIDER_SITE_OTHER): Payer: BC Managed Care – PPO | Admitting: Family Medicine

## 2021-01-11 ENCOUNTER — Encounter: Payer: Self-pay | Admitting: Family Medicine

## 2021-01-11 DIAGNOSIS — E785 Hyperlipidemia, unspecified: Secondary | ICD-10-CM

## 2021-01-11 DIAGNOSIS — E118 Type 2 diabetes mellitus with unspecified complications: Secondary | ICD-10-CM

## 2021-01-11 DIAGNOSIS — I10 Essential (primary) hypertension: Secondary | ICD-10-CM | POA: Diagnosis not present

## 2021-01-11 DIAGNOSIS — E039 Hypothyroidism, unspecified: Secondary | ICD-10-CM

## 2021-01-11 LAB — BAYER DCA HB A1C WAIVED: HB A1C (BAYER DCA - WAIVED): 5.8 % (ref <7.0)

## 2021-01-12 LAB — CMP14+EGFR
ALT: 60 IU/L — ABNORMAL HIGH (ref 0–44)
AST: 34 IU/L (ref 0–40)
Albumin/Globulin Ratio: 1.8 (ref 1.2–2.2)
Albumin: 4.5 g/dL (ref 4.0–5.0)
Alkaline Phosphatase: 127 IU/L — ABNORMAL HIGH (ref 44–121)
BUN/Creatinine Ratio: 14 (ref 9–20)
BUN: 11 mg/dL (ref 6–24)
Bilirubin Total: 0.5 mg/dL (ref 0.0–1.2)
CO2: 24 mmol/L (ref 20–29)
Calcium: 9.8 mg/dL (ref 8.7–10.2)
Chloride: 100 mmol/L (ref 96–106)
Creatinine, Ser: 0.78 mg/dL (ref 0.76–1.27)
Globulin, Total: 2.5 g/dL (ref 1.5–4.5)
Glucose: 93 mg/dL (ref 65–99)
Potassium: 4.8 mmol/L (ref 3.5–5.2)
Sodium: 139 mmol/L (ref 134–144)
Total Protein: 7 g/dL (ref 6.0–8.5)
eGFR: 113 mL/min/{1.73_m2} (ref >59)

## 2021-01-12 LAB — CBC WITH DIFFERENTIAL/PLATELET
Basophils Absolute: 0.1 10*3/uL (ref 0.0–0.2)
Basos: 1 %
EOS (ABSOLUTE): 0.3 10*3/uL (ref 0.0–0.4)
Eos: 4 %
Hematocrit: 48.7 % (ref 37.5–51.0)
Hemoglobin: 16.1 g/dL (ref 13.0–17.7)
Immature Grans (Abs): 0 10*3/uL (ref 0.0–0.1)
Immature Granulocytes: 0 %
Lymphocytes Absolute: 1.5 10*3/uL (ref 0.7–3.1)
Lymphs: 19 %
MCH: 27.4 pg (ref 26.6–33.0)
MCHC: 33.1 g/dL (ref 31.5–35.7)
MCV: 83 fL (ref 79–97)
Monocytes Absolute: 0.7 10*3/uL (ref 0.1–0.9)
Monocytes: 8 %
Neutrophils Absolute: 5.8 10*3/uL (ref 1.4–7.0)
Neutrophils: 68 %
Platelets: 321 10*3/uL (ref 150–450)
RBC: 5.88 x10E6/uL — ABNORMAL HIGH (ref 4.14–5.80)
RDW: 13.1 % (ref 11.6–15.4)
WBC: 8.3 10*3/uL (ref 3.4–10.8)

## 2021-01-12 LAB — LIPID PANEL
Chol/HDL Ratio: 4 ratio (ref 0.0–5.0)
Cholesterol, Total: 157 mg/dL (ref 100–199)
HDL: 39 mg/dL — ABNORMAL LOW (ref >39)
LDL Chol Calc (NIH): 100 mg/dL — ABNORMAL HIGH (ref 0–99)
Triglycerides: 95 mg/dL (ref 0–149)
VLDL Cholesterol Cal: 18 mg/dL (ref 5–40)

## 2021-01-12 LAB — TSH: TSH: 0.524 u[IU]/mL (ref 0.450–4.500)

## 2021-01-13 ENCOUNTER — Encounter: Payer: Self-pay | Admitting: Family Medicine

## 2021-01-13 ENCOUNTER — Ambulatory Visit: Payer: BC Managed Care – PPO | Admitting: Family Medicine

## 2021-01-19 NOTE — Progress Notes (Signed)
Patient had to leave before he was seen by provider

## 2021-02-24 ENCOUNTER — Ambulatory Visit: Payer: BC Managed Care – PPO | Admitting: Family Medicine

## 2021-03-24 ENCOUNTER — Other Ambulatory Visit: Payer: Self-pay

## 2021-03-24 ENCOUNTER — Encounter: Payer: Self-pay | Admitting: Family Medicine

## 2021-03-24 ENCOUNTER — Ambulatory Visit (INDEPENDENT_AMBULATORY_CARE_PROVIDER_SITE_OTHER): Payer: BC Managed Care – PPO | Admitting: Family Medicine

## 2021-03-24 VITALS — BP 170/88 | HR 76 | Ht 75.0 in | Wt 369.0 lb

## 2021-03-24 DIAGNOSIS — I10 Essential (primary) hypertension: Secondary | ICD-10-CM

## 2021-03-24 DIAGNOSIS — Z0000 Encounter for general adult medical examination without abnormal findings: Secondary | ICD-10-CM

## 2021-03-24 DIAGNOSIS — Z0001 Encounter for general adult medical examination with abnormal findings: Secondary | ICD-10-CM | POA: Diagnosis not present

## 2021-03-24 DIAGNOSIS — E785 Hyperlipidemia, unspecified: Secondary | ICD-10-CM

## 2021-03-24 DIAGNOSIS — Z23 Encounter for immunization: Secondary | ICD-10-CM | POA: Diagnosis not present

## 2021-03-24 DIAGNOSIS — Z85850 Personal history of malignant neoplasm of thyroid: Secondary | ICD-10-CM

## 2021-03-24 DIAGNOSIS — E039 Hypothyroidism, unspecified: Secondary | ICD-10-CM

## 2021-03-24 MED ORDER — IBUPROFEN 200 MG PO TABS: 400.0000 mg | ORAL_TABLET | Freq: Once | ORAL | Status: AC

## 2021-03-24 NOTE — Progress Notes (Signed)
BP (!) 170/88   Pulse 76   Ht 6' 3"  (1.905 m)   Wt (!) 369 lb (167.4 kg)   SpO2 96%   BMI 46.12 kg/m    Subjective:   Patient ID: Jay Lang, male    DOB: 20-Oct-1975, 45 y.o.   MRN: [id number]  HPI: Jay Lang is a 45 y.o. male presenting on 03/24/2021 for Medical Management of Chronic Issues, Hypertension, and Hypothyroidism   HPI Hypertension Patient is currently on amlodipine, and their blood pressure today is 170/88. Patient denies any lightheadedness or dizziness. Patient denies headaches, blurred vision, chest pains, shortness of breath, or weakness. Denies any side effects from medication and is content with current medication.   Hyperlipidemia and dyslipidemia Patient is coming in for recheck of his hyperlipidemia. The patient is currently taking atorvastatin. They deny any issues with myalgias or history of liver damage from it. They deny any focal numbness or weakness or chest pain.   Hypothyroidism recheck with history of thyroidectomy due to cancer. Patient is coming in for thyroid recheck today as well. They deny any issues with hair changes or heat or cold problems or diarrhea or constipation. They deny any chest pain or palpitations. They are currently on levothyroxine 137 micrograms   Relevant past medical, surgical, family and social history reviewed and updated as indicated. Interim medical history since our last visit reviewed. Allergies and medications reviewed and updated.  Review of Systems  Constitutional:  Negative for chills and fever.  Eyes:  Negative for visual disturbance.  Respiratory:  Negative for shortness of breath and wheezing.   Cardiovascular:  Negative for chest pain and leg swelling.  Musculoskeletal:  Negative for back pain and gait problem.  Skin:  Negative for rash.  Neurological:  Negative for dizziness, weakness and light-headedness.  All other systems reviewed and are negative.  Per HPI unless specifically indicated  above   Allergies as of 03/24/2021       Reactions   Penicillins    Rash        Medication List        Accurate as of March 24, 2021  3:20 PM. If you have any questions, ask your nurse or doctor.          amLODipine 5 MG tablet Commonly known as: NORVASC Take 1 tablet (5 mg total) by mouth daily.   atorvastatin 20 MG tablet Commonly known as: LIPITOR Take 1 tablet (20 mg total) by mouth daily. (Needs to be seen before next refill)   esomeprazole 40 MG capsule Commonly known as: NexIUM Take 1 capsule (40 mg total) by mouth daily.   levothyroxine 137 MCG tablet Commonly known as: SYNTHROID Take 2 tablets (274 mcg total) by mouth daily.         Objective:   BP (!) 170/88   Pulse 76   Ht 6' 3"  (1.905 m)   Wt (!) 369 lb (167.4 kg)   SpO2 96%   BMI 46.12 kg/m   Wt Readings from Last 3 Encounters:  03/24/21 (!) 369 lb (167.4 kg)  01/11/21 (!) 348 lb (157.9 kg)  07/13/20 (!) 358 lb (162.4 kg)    Physical Exam Vitals and nursing note reviewed.  Constitutional:      General: He is not in acute distress.    Appearance: He is well-developed. He is not diaphoretic.  Eyes:     General: No scleral icterus.    Conjunctiva/sclera: Conjunctivae normal.  Neck:     Thyroid: No  thyromegaly.  Cardiovascular:     Rate and Rhythm: Normal rate and regular rhythm.     Heart sounds: Normal heart sounds. No murmur heard. Pulmonary:     Effort: Pulmonary effort is normal. No respiratory distress.     Breath sounds: Normal breath sounds. No wheezing.  Musculoskeletal:        General: Normal range of motion.     Cervical back: Neck supple.  Lymphadenopathy:     Cervical: No cervical adenopathy.  Skin:    General: Skin is warm and dry.     Findings: No rash.  Neurological:     Mental Status: He is alert and oriented to person, place, and time.     Coordination: Coordination normal.  Psychiatric:        Behavior: Behavior normal.    Results for orders placed  or performed in visit on 01/11/21  Bayer DCA Hb A1c Waived  Result Value Ref Range   HB A1C (BAYER DCA - WAIVED) 5.8 <7.0 %  TSH  Result Value Ref Range   TSH 0.524 0.450 - 4.500 uIU/mL  Lipid panel  Result Value Ref Range   Cholesterol, Total 157 100 - 199 mg/dL   Triglycerides 95 0 - 149 mg/dL   HDL 39 (L) >39 mg/dL   VLDL Cholesterol Cal 18 5 - 40 mg/dL   LDL Chol Calc (NIH) 100 (H) 0 - 99 mg/dL   Chol/HDL Ratio 4.0 0.0 - 5.0 ratio  CMP14+EGFR  Result Value Ref Range   Glucose 93 65 - 99 mg/dL   BUN 11 6 - 24 mg/dL   Creatinine, Ser 0.78 0.76 - 1.27 mg/dL   eGFR 113 >59 mL/min/1.73   BUN/Creatinine Ratio 14 9 - 20   Sodium 139 134 - 144 mmol/L   Potassium 4.8 3.5 - 5.2 mmol/L   Chloride 100 96 - 106 mmol/L   CO2 24 20 - 29 mmol/L   Calcium 9.8 8.7 - 10.2 mg/dL   Total Protein 7.0 6.0 - 8.5 g/dL   Albumin 4.5 4.0 - 5.0 g/dL   Globulin, Total 2.5 1.5 - 4.5 g/dL   Albumin/Globulin Ratio 1.8 1.2 - 2.2   Bilirubin Total 0.5 0.0 - 1.2 mg/dL   Alkaline Phosphatase 127 (H) 44 - 121 IU/L   AST 34 0 - 40 IU/L   ALT 60 (H) 0 - 44 IU/L  CBC with Differential/Platelet  Result Value Ref Range   WBC 8.3 3.4 - 10.8 x10E3/uL   RBC 5.88 (H) 4.14 - 5.80 x10E6/uL   Hemoglobin 16.1 13.0 - 17.7 g/dL   Hematocrit 48.7 37.5 - 51.0 %   MCV 83 79 - 97 fL   MCH 27.4 26.6 - 33.0 pg   MCHC 33.1 31.5 - 35.7 g/dL   RDW 13.1 11.6 - 15.4 %   Platelets 321 150 - 450 x10E3/uL   Neutrophils 68 Not Estab. %   Lymphs 19 Not Estab. %   Monocytes 8 Not Estab. %   Eos 4 Not Estab. %   Basos 1 Not Estab. %   Neutrophils Absolute 5.8 1.4 - 7.0 x10E3/uL   Lymphocytes Absolute 1.5 0.7 - 3.1 x10E3/uL   Monocytes Absolute 0.7 0.1 - 0.9 x10E3/uL   EOS (ABSOLUTE) 0.3 0.0 - 0.4 x10E3/uL   Basophils Absolute 0.1 0.0 - 0.2 x10E3/uL   Immature Granulocytes 0 Not Estab. %   Immature Grans (Abs) 0.0 0.0 - 0.1 x10E3/uL    Assessment & Plan:   Problem List Items Addressed  This Visit       Cardiovascular  and Mediastinum   Hypertension     Endocrine   Hypothyroid     Other   History of thyroid cancer   Dyslipidemia   Other Visit Diagnoses     Well adult exam    -  Primary   Flu vaccine need       Relevant Medications   ibuprofen (ADVIL) tablet 400 mg   Need for immunization against influenza       Relevant Orders   Flu Vaccine QUAD 53moIM (Fluarix, Fluzone & Alfiuria Quad PF) (Completed)     Patient will check blood pressure closely over the next 2 weeks and send some numbers forming, his labs looked good from July so we will do labs in January again or February  Follow up plan: Return in about 5 months (around 08/24/2021), or if symptoms worsen or fail to improve, for Physical exam.  Counseling provided for all of the vaccine components Orders Placed This Encounter  Procedures   Flu Vaccine QUAD 633moM (Fluarix, Fluzone & Alfiuria Quad PF)     JoCaryl PinaMD [place]/23/2022, 3:20 PM

## 2021-07-26 ENCOUNTER — Other Ambulatory Visit: Payer: Self-pay | Admitting: Family Medicine

## 2021-08-24 ENCOUNTER — Other Ambulatory Visit: Payer: Self-pay | Admitting: Family Medicine

## 2021-08-24 DIAGNOSIS — I10 Essential (primary) hypertension: Secondary | ICD-10-CM

## 2021-08-27 ENCOUNTER — Other Ambulatory Visit: Payer: Self-pay | Admitting: Family Medicine

## 2021-09-18 ENCOUNTER — Other Ambulatory Visit: Payer: Self-pay | Admitting: Family Medicine

## 2021-09-18 DIAGNOSIS — I10 Essential (primary) hypertension: Secondary | ICD-10-CM

## 2021-09-23 ENCOUNTER — Other Ambulatory Visit: Payer: Self-pay | Admitting: Family Medicine

## 2021-09-23 DIAGNOSIS — I10 Essential (primary) hypertension: Secondary | ICD-10-CM

## 2021-09-25 NOTE — Telephone Encounter (Signed)
Dettinger NTBS 30 days given 08/28/21 ?

## 2021-09-26 NOTE — Telephone Encounter (Signed)
Apt scheduled for 11/08/2021 next available with Dettinger. Pt said that he does not want refills until he sees the Dr. I told him that nurse can send in a refill till his apt and he got upset and ask than why does he need an apt.  ?

## 2021-09-27 ENCOUNTER — Encounter: Payer: Self-pay | Admitting: Family Medicine

## 2021-10-03 MED ORDER — ATORVASTATIN CALCIUM 20 MG PO TABS: 20.0000 mg | ORAL_TABLET | Freq: Every day | ORAL | 0 refills | Status: DC

## 2021-10-03 MED ORDER — AMLODIPINE BESYLATE 5 MG PO TABS: 5.0000 mg | ORAL_TABLET | Freq: Every day | ORAL | 0 refills | Status: DC

## 2021-10-03 NOTE — Addendum Note (Signed)
Addended by: Antonietta Barcelona D on: 10/03/2021 09:52 AM ? ? Modules accepted: Orders ? ?

## 2021-10-03 NOTE — Telephone Encounter (Signed)
30 day refill sent after reading MyChart message, pt did not answer if he had enough till his appt ?

## 2021-10-19 DIAGNOSIS — C73 Malignant neoplasm of thyroid gland: Secondary | ICD-10-CM | POA: Diagnosis not present

## 2021-10-19 DIAGNOSIS — R7301 Impaired fasting glucose: Secondary | ICD-10-CM | POA: Diagnosis not present

## 2021-10-19 DIAGNOSIS — E7800 Pure hypercholesterolemia, unspecified: Secondary | ICD-10-CM | POA: Diagnosis not present

## 2021-10-19 DIAGNOSIS — E890 Postprocedural hypothyroidism: Secondary | ICD-10-CM | POA: Diagnosis not present

## 2021-10-26 DIAGNOSIS — R7301 Impaired fasting glucose: Secondary | ICD-10-CM | POA: Diagnosis not present

## 2021-10-26 DIAGNOSIS — E7800 Pure hypercholesterolemia, unspecified: Secondary | ICD-10-CM | POA: Diagnosis not present

## 2021-10-26 DIAGNOSIS — C73 Malignant neoplasm of thyroid gland: Secondary | ICD-10-CM | POA: Diagnosis not present

## 2021-10-26 DIAGNOSIS — E890 Postprocedural hypothyroidism: Secondary | ICD-10-CM | POA: Diagnosis not present

## 2021-10-27 ENCOUNTER — Other Ambulatory Visit: Payer: Self-pay | Admitting: Family Medicine

## 2021-10-27 DIAGNOSIS — I10 Essential (primary) hypertension: Secondary | ICD-10-CM

## 2021-11-08 ENCOUNTER — Ambulatory Visit (INDEPENDENT_AMBULATORY_CARE_PROVIDER_SITE_OTHER): Payer: BC Managed Care – PPO | Admitting: Family Medicine

## 2021-11-08 ENCOUNTER — Encounter: Payer: Self-pay | Admitting: Family Medicine

## 2021-11-08 VITALS — BP 148/90 | HR 71 | Wt 378.0 lb

## 2021-11-08 DIAGNOSIS — E785 Hyperlipidemia, unspecified: Secondary | ICD-10-CM | POA: Diagnosis not present

## 2021-11-08 DIAGNOSIS — Z85850 Personal history of malignant neoplasm of thyroid: Secondary | ICD-10-CM | POA: Diagnosis not present

## 2021-11-08 DIAGNOSIS — I10 Essential (primary) hypertension: Secondary | ICD-10-CM | POA: Diagnosis not present

## 2021-11-08 DIAGNOSIS — E039 Hypothyroidism, unspecified: Secondary | ICD-10-CM | POA: Diagnosis not present

## 2021-11-08 LAB — BAYER DCA HB A1C WAIVED: HB A1C (BAYER DCA - WAIVED): 5.4 % (ref 4.8–5.6)

## 2021-11-08 MED ORDER — ATORVASTATIN CALCIUM 20 MG PO TABS: 20.0000 mg | ORAL_TABLET | Freq: Every day | ORAL | 3 refills | Status: DC

## 2021-11-08 MED ORDER — ESOMEPRAZOLE MAGNESIUM 40 MG PO CPDR: 40.0000 mg | DELAYED_RELEASE_CAPSULE | Freq: Every day | ORAL | 3 refills | Status: DC

## 2021-11-08 MED ORDER — AMLODIPINE BESYLATE 5 MG PO TABS: 5.0000 mg | ORAL_TABLET | Freq: Every day | ORAL | 3 refills | Status: DC

## 2021-11-08 MED ORDER — LEVOTHYROXINE SODIUM 137 MCG PO TABS: ORAL_TABLET | ORAL | 1 refills | Status: DC

## 2021-11-08 NOTE — Progress Notes (Signed)
? ?BP (!) 148/90   Pulse 71   Wt (!) 378 lb (171.5 kg)   SpO2 97%   BMI 47.25 kg/m?   ? ?Subjective:  ? ?Patient ID: Jay Lang, male    DOB: January 30, 1976, 46 y.o.   MRN: [id number] ? ?HPI: ?Jay Lang is a 46 y.o. male presenting on 11/08/2021 for Medical Management of Chronic Issues, Hypothyroidism, and Hypertension ? ? ?HPI ?Hyperlipidemia ?Patient is coming in for recheck of his hyperlipidemia. The patient is currently taking atorvastatin. They deny any issues with myalgias or history of liver damage from it. They deny any focal numbness or weakness or chest pain.  ? ?Hypothyroidism recheck with history of thyroid cancer ?Patient is coming in for thyroid recheck today as well. They deny any issues with hair changes or heat or cold problems or diarrhea or constipation. They deny any chest pain or palpitations. They are currently on levothyroxine 137 micrograms  ? ?Hypertension ?Patient is currently on amlodipine, and their blood pressure today is 148/90. Patient denies any lightheadedness or dizziness. Patient denies headaches, blurred vision, chest pains, shortness of breath, or weakness. Denies any side effects from medication and is content with current medication.  ? ?Patient is having some tension and stress related issues at home, his mother-in-law recently had a stroke and his wife had a mini stroke this last year and has caused him to be more on edge and he would like to have somebody to do talk or counseling or talk therapy.  We gave him some ideas. ? ?Relevant past medical, surgical, family and social history reviewed and updated as indicated. Interim medical history since our last visit reviewed. ?Allergies and medications reviewed and updated. ? ?Review of Systems  ?Constitutional:  Negative for chills and fever.  ?Eyes:  Negative for visual disturbance.  ?Respiratory:  Negative for shortness of breath and wheezing.   ?Cardiovascular:  Negative for chest pain and leg swelling.  ?Musculoskeletal:   Negative for back pain and gait problem.  ?Skin:  Negative for rash.  ?Neurological:  Negative for dizziness, weakness and light-headedness.  ?Psychiatric/Behavioral:  Negative for dysphoric mood, self-injury, sleep disturbance and suicidal ideas. The patient is nervous/anxious.   ?All other systems reviewed and are negative. ? ?Per HPI unless specifically indicated above ? ? ?Allergies as of 11/08/2021   ? ?   Reactions  ? Penicillins   ? Rash  ? ?  ? ?  ?Medication List  ?  ? ?  ? Accurate as of Nov 08, 2021  3:01 PM. If you have any questions, ask your nurse or doctor.  ?  ?  ? ?  ? ?amLODipine 5 MG tablet ?Commonly known as: NORVASC ?Take 1 tablet (5 mg total) by mouth daily. ?  ?atorvastatin 20 MG tablet ?Commonly known as: LIPITOR ?Take 1 tablet (20 mg total) by mouth daily. ?  ?esomeprazole 40 MG capsule ?Commonly known as: NexIUM ?Take 1 capsule (40 mg total) by mouth daily. ?  ?levothyroxine 137 MCG tablet ?Commonly known as: Levoxyl ?TAKE 2 TABLETS (274 MCG TOTAL) BY MOUTH DAILY. ?  ? ?  ? ? ? ?Objective:  ? ?BP (!) 148/90   Pulse 71   Wt (!) 378 lb (171.5 kg)   SpO2 97%   BMI 47.25 kg/m?   ?Wt Readings from Last 3 Encounters:  ?11/08/21 (!) 378 lb (171.5 kg)  ?03/24/21 (!) 369 lb (167.4 kg)  ?01/11/21 (!) 348 lb (157.9 kg)  ?  ?Physical Exam ?Vitals and nursing  note reviewed.  ?Constitutional:   ?   General: He is not in acute distress. ?   Appearance: He is well-developed. He is not diaphoretic.  ?Eyes:  ?   General: No scleral icterus. ?   Conjunctiva/sclera: Conjunctivae normal.  ?Neck:  ?   Thyroid: No thyromegaly.  ?Cardiovascular:  ?   Rate and Rhythm: Normal rate and regular rhythm.  ?   Heart sounds: Normal heart sounds. No murmur heard. ?Pulmonary:  ?   Effort: Pulmonary effort is normal. No respiratory distress.  ?   Breath sounds: Normal breath sounds. No wheezing.  ?Musculoskeletal:     ?   General: Normal range of motion.  ?   Cervical back: Neck supple.  ?Lymphadenopathy:  ?   Cervical:  No cervical adenopathy.  ?Skin: ?   General: Skin is warm and dry.  ?   Findings: No rash.  ?Neurological:  ?   Mental Status: He is alert and oriented to person, place, and time.  ?   Coordination: Coordination normal.  ?Psychiatric:     ?   Behavior: Behavior normal.  ? ? ? ? ?Assessment & Plan:  ? ?Problem List Items Addressed This Visit   ? ?  ? Cardiovascular and Mediastinum  ? Hypertension  ? Relevant Medications  ? amLODipine (NORVASC) 5 MG tablet  ? atorvastatin (LIPITOR) 20 MG tablet  ?  ? Endocrine  ? Hypothyroid - Primary  ? Relevant Medications  ? levothyroxine (LEVOXYL) 137 MCG tablet  ? Other Relevant Orders  ? TSH  ?  ? Other  ? Dyslipidemia  ? Relevant Medications  ? atorvastatin (LIPITOR) 20 MG tablet  ? Other Relevant Orders  ? Lipid panel  ? Morbid obesity ([place])  ? Relevant Orders  ? Bayer DCA Hb A1c Waived  ? History of thyroid cancer  ? Relevant Medications  ? levothyroxine (LEVOXYL) 137 MCG tablet  ? ?Other Visit Diagnoses   ? ? Essential hypertension      ? Relevant Medications  ? amLODipine (NORVASC) 5 MG tablet  ? atorvastatin (LIPITOR) 20 MG tablet  ? Other Relevant Orders  ? CBC with Differential/Platelet  ? CMP14+EGFR  ? ?  ?  ?Blood pressure slightly up, will want him to monitor his blood pressure closely over the next month and send [place] some numbers. ? ?He is discussing weight loss and is thinking of going [place] from his endocrinologist. ? ?We discussed possibility of counseling and talk therapy to help with some home related stress and he is going to try calling at some people from [place]. ?Follow up plan: ?Return in about 6 months (around 05/11/2022), or if symptoms worsen or fail to improve, for Hypertension and dyslipidemia and hypothyroidism. ? ?Counseling provided for all of the vaccine components ?Orders Placed This Encounter  ?Procedures  ? CBC with Differential/Platelet  ? CMP14+EGFR  ? Lipid panel  ? TSH  ? Bayer DCA Hb A1c Waived  ? ? ?Caryl Pina, MD ?[place] ?11/08/2021, 3:01 PM ? ? ? ? ?

## 2021-11-09 LAB — CMP14+EGFR
ALT: 49 IU/L — ABNORMAL HIGH (ref 0–44)
AST: 31 IU/L (ref 0–40)
Albumin/Globulin Ratio: 1.8 (ref 1.2–2.2)
Albumin: 4.7 g/dL (ref 4.0–5.0)
Alkaline Phosphatase: 128 IU/L — ABNORMAL HIGH (ref 44–121)
BUN/Creatinine Ratio: 13 (ref 9–20)
BUN: 10 mg/dL (ref 6–24)
Bilirubin Total: 0.4 mg/dL (ref 0.0–1.2)
CO2: 24 mmol/L (ref 20–29)
Calcium: 9.6 mg/dL (ref 8.7–10.2)
Chloride: 99 mmol/L (ref 96–106)
Creatinine, Ser: 0.75 mg/dL — ABNORMAL LOW (ref 0.76–1.27)
Globulin, Total: 2.6 g/dL (ref 1.5–4.5)
Glucose: 86 mg/dL (ref 70–99)
Potassium: 4.4 mmol/L (ref 3.5–5.2)
Sodium: 140 mmol/L (ref 134–144)
Total Protein: 7.3 g/dL (ref 6.0–8.5)
eGFR: 114 mL/min/{1.73_m2} (ref >59)

## 2021-11-09 LAB — LIPID PANEL
Chol/HDL Ratio: 4.1 ratio (ref 0.0–5.0)
Cholesterol, Total: 181 mg/dL (ref 100–199)
HDL: 44 mg/dL (ref >39)
LDL Chol Calc (NIH): 107 mg/dL — ABNORMAL HIGH (ref 0–99)
Triglycerides: 170 mg/dL — ABNORMAL HIGH (ref 0–149)
VLDL Cholesterol Cal: 30 mg/dL (ref 5–40)

## 2021-11-09 LAB — CBC WITH DIFFERENTIAL/PLATELET
Basophils Absolute: 0.1 10*3/uL (ref 0.0–0.2)
Basos: 1 %
EOS (ABSOLUTE): 0.4 10*3/uL (ref 0.0–0.4)
Eos: 4 %
Hematocrit: 51.1 % — ABNORMAL HIGH (ref 37.5–51.0)
Hemoglobin: 16.7 g/dL (ref 13.0–17.7)
Immature Grans (Abs): 0 10*3/uL (ref 0.0–0.1)
Immature Granulocytes: 0 %
Lymphocytes Absolute: 1.8 10*3/uL (ref 0.7–3.1)
Lymphs: 18 %
MCH: 26.8 pg (ref 26.6–33.0)
MCHC: 32.7 g/dL (ref 31.5–35.7)
MCV: 82 fL (ref 79–97)
Monocytes Absolute: 0.7 10*3/uL (ref 0.1–0.9)
Monocytes: 7 %
Neutrophils Absolute: 6.9 10*3/uL (ref 1.4–7.0)
Neutrophils: 70 %
Platelets: 329 10*3/uL (ref 150–450)
RBC: 6.22 x10E6/uL — ABNORMAL HIGH (ref 4.14–5.80)
RDW: 13.5 % (ref 11.6–15.4)
WBC: 10 10*3/uL (ref 3.4–10.8)

## 2021-11-09 LAB — TSH: TSH: 3.33 u[IU]/mL (ref 0.450–4.500)

## 2022-01-30 DIAGNOSIS — E890 Postprocedural hypothyroidism: Secondary | ICD-10-CM | POA: Diagnosis not present

## 2022-01-30 DIAGNOSIS — E7800 Pure hypercholesterolemia, unspecified: Secondary | ICD-10-CM | POA: Diagnosis not present

## 2022-01-30 DIAGNOSIS — R7301 Impaired fasting glucose: Secondary | ICD-10-CM | POA: Diagnosis not present

## 2022-01-31 DIAGNOSIS — R051 Acute cough: Secondary | ICD-10-CM | POA: Diagnosis not present

## 2022-01-31 DIAGNOSIS — J22 Unspecified acute lower respiratory infection: Secondary | ICD-10-CM | POA: Diagnosis not present

## 2022-02-02 DIAGNOSIS — E890 Postprocedural hypothyroidism: Secondary | ICD-10-CM | POA: Diagnosis not present

## 2022-02-02 DIAGNOSIS — C73 Malignant neoplasm of thyroid gland: Secondary | ICD-10-CM | POA: Diagnosis not present

## 2022-02-02 DIAGNOSIS — R7301 Impaired fasting glucose: Secondary | ICD-10-CM | POA: Diagnosis not present

## 2022-02-12 DIAGNOSIS — B9689 Other specified bacterial agents as the cause of diseases classified elsewhere: Secondary | ICD-10-CM | POA: Diagnosis not present

## 2022-02-12 DIAGNOSIS — J189 Pneumonia, unspecified organism: Secondary | ICD-10-CM | POA: Diagnosis not present

## 2022-02-12 DIAGNOSIS — R9389 Abnormal findings on diagnostic imaging of other specified body structures: Secondary | ICD-10-CM | POA: Diagnosis not present

## 2022-02-12 DIAGNOSIS — J208 Acute bronchitis due to other specified organisms: Secondary | ICD-10-CM | POA: Diagnosis not present

## 2022-05-05 DIAGNOSIS — R059 Cough, unspecified: Secondary | ICD-10-CM | POA: Diagnosis not present

## 2022-05-05 DIAGNOSIS — R0981 Nasal congestion: Secondary | ICD-10-CM | POA: Diagnosis not present

## 2022-05-05 DIAGNOSIS — Z1152 Encounter for screening for COVID-19: Secondary | ICD-10-CM | POA: Diagnosis not present

## 2022-05-05 DIAGNOSIS — J069 Acute upper respiratory infection, unspecified: Secondary | ICD-10-CM | POA: Diagnosis not present

## 2022-05-23 ENCOUNTER — Telehealth: Payer: Self-pay | Admitting: Family Medicine

## 2022-05-23 NOTE — Telephone Encounter (Signed)
Go ahead and place the patient on the wait list and schedule him at the soonest available but I would say for now try not to put him on the call day, especially this time year.  Usually there are cancellations and things that open up.

## 2022-05-23 NOTE — Telephone Encounter (Signed)
Would you be willing to see him on an acute day for CPE?

## 2022-05-23 NOTE — Telephone Encounter (Signed)
Pt currently scheduled to see Dr Dettinger on 07/11/22 for CPE. He is aware that we will call him to be seen sooner if Dr Dettinger has any cancellations.

## 2022-06-01 ENCOUNTER — Encounter: Payer: BC Managed Care – PPO | Admitting: Family Medicine

## 2022-06-14 ENCOUNTER — Encounter: Payer: Self-pay | Admitting: Family Medicine

## 2022-06-14 ENCOUNTER — Ambulatory Visit (INDEPENDENT_AMBULATORY_CARE_PROVIDER_SITE_OTHER): Payer: BC Managed Care – PPO | Admitting: Family Medicine

## 2022-06-14 ENCOUNTER — Other Ambulatory Visit: Payer: Self-pay

## 2022-06-14 VITALS — BP 161/88 | HR 74 | Temp 98.4°F | Ht 75.0 in | Wt 372.0 lb

## 2022-06-14 DIAGNOSIS — Z23 Encounter for immunization: Secondary | ICD-10-CM | POA: Diagnosis not present

## 2022-06-14 DIAGNOSIS — Z0000 Encounter for general adult medical examination without abnormal findings: Secondary | ICD-10-CM

## 2022-06-14 DIAGNOSIS — I10 Essential (primary) hypertension: Secondary | ICD-10-CM

## 2022-06-14 DIAGNOSIS — E039 Hypothyroidism, unspecified: Secondary | ICD-10-CM | POA: Diagnosis not present

## 2022-06-14 DIAGNOSIS — Z0001 Encounter for general adult medical examination with abnormal findings: Secondary | ICD-10-CM

## 2022-06-14 DIAGNOSIS — E8881 Metabolic syndrome: Secondary | ICD-10-CM

## 2022-06-14 DIAGNOSIS — Z85850 Personal history of malignant neoplasm of thyroid: Secondary | ICD-10-CM | POA: Diagnosis not present

## 2022-06-14 DIAGNOSIS — E785 Hyperlipidemia, unspecified: Secondary | ICD-10-CM

## 2022-06-14 NOTE — Progress Notes (Signed)
BP (!) 161/88   Pulse 74   Temp 98.4 F (36.9 C)   Ht '6\' 3"'$  (1.905 m)   Wt (!) 372 lb (168.7 kg)   SpO2 95%   BMI 46.50 kg/m    Subjective:   Patient ID: Jay Lang, male    DOB: 1976-03-22, 46 y.o.   MRN: [id number]  HPI: Jay Lang is a 46 y.o. male presenting on 06/14/2022 for Medical Management of Chronic Issues and Hypothyroidism   HPI Physical exam Patient is coming in today for physical exam recheck chronic medical issues. Patient denies any chest pain, shortness of breath, headaches or vision issues, abdominal complaints, diarrhea, nausea, vomiting, or joint issues.  Patient has gained weight previously.  She has metabolic syndrome.  Hypertension Patient is currently on amlodipine, and their blood pressure today is 161/88. Patient denies any lightheadedness or dizziness. Patient denies headaches, blurred vision, chest pains, shortness of breath, or weakness. Denies any side effects from medication and is content with current medication.   Hypothyroidism recheck Patient is coming in for thyroid recheck today as well. They deny any issues with hair changes or heat or cold problems or diarrhea or constipation. They deny any chest pain or palpitations. They are currently on levothyroxine to 74 micrograms   Hyperlipidemia Patient is coming in for recheck of his hyperlipidemia. The patient is currently taking atorvastatin. They deny any issues with myalgias or history of liver damage from it. They deny any focal numbness or weakness or chest pain.   Relevant past medical, surgical, family and social history reviewed and updated as indicated. Interim medical history since our last visit reviewed. Allergies and medications reviewed and updated.  Review of Systems  Constitutional:  Negative for chills and fever.  Respiratory:  Negative for shortness of breath and wheezing.   Cardiovascular:  Negative for chest pain and leg swelling.  Musculoskeletal:  Negative for back pain and  gait problem.  Skin:  Negative for rash.  All other systems reviewed and are negative.   Per HPI unless specifically indicated above   Allergies as of 06/14/2022       Reactions   Penicillins    Rash        Medication List        Accurate as of June 14, 2022  3:06 PM. If you have any questions, ask your nurse or doctor.          amLODipine 5 MG tablet Commonly known as: NORVASC Take 1 tablet (5 mg total) by mouth daily.   atorvastatin 20 MG tablet Commonly known as: LIPITOR Take 1 tablet (20 mg total) by mouth daily.   esomeprazole 40 MG capsule Commonly known as: NexIUM Take 1 capsule (40 mg total) by mouth daily.   levothyroxine 137 MCG tablet Commonly known as: Levoxyl TAKE 2 TABLETS (274 MCG TOTAL) BY MOUTH DAILY.         Objective:   BP (!) 161/88   Pulse 74   Temp 98.4 F (36.9 C)   Ht '6\' 3"'$  (1.905 m)   Wt (!) 372 lb (168.7 kg)   SpO2 95%   BMI 46.50 kg/m   Wt Readings from Last 3 Encounters:  06/14/22 (!) 372 lb (168.7 kg)  11/08/21 (!) 378 lb (171.5 kg)  03/24/21 (!) 369 lb (167.4 kg)    Physical Exam Vitals reviewed.  Constitutional:      General: He is not in acute distress.    Appearance: He is well-developed. He is  not diaphoretic.  HENT:     Right Ear: External ear normal.     Left Ear: External ear normal.     Nose: Nose normal.     Mouth/Throat:     Pharynx: No oropharyngeal exudate.  Eyes:     General: No scleral icterus.       Right eye: No discharge.     Conjunctiva/sclera: Conjunctivae normal.     Pupils: Pupils are equal, round, and reactive to light.  Neck:     Thyroid: No thyromegaly.  Cardiovascular:     Rate and Rhythm: Normal rate and regular rhythm.     Heart sounds: Normal heart sounds. No murmur heard. Pulmonary:     Effort: Pulmonary effort is normal. No respiratory distress.     Breath sounds: Normal breath sounds. No wheezing.  Abdominal:     General: Bowel sounds are normal. There is no  distension.     Palpations: Abdomen is soft.     Tenderness: There is no abdominal tenderness. There is no guarding or rebound.  Musculoskeletal:        General: Normal range of motion.     Cervical back: Neck supple.  Lymphadenopathy:     Cervical: No cervical adenopathy.  Skin:    General: Skin is warm and dry.     Findings: No rash.  Neurological:     Mental Status: He is alert and oriented to person, place, and time.     Coordination: Coordination normal.  Psychiatric:        Behavior: Behavior normal.       Assessment & Plan:   Problem List Items Addressed This Visit       Cardiovascular and Mediastinum   Hypertension     Endocrine   Hypothyroid     Other   Dyslipidemia   Morbid obesity ([place])   Metabolic syndrome   History of thyroid cancer   Other Visit Diagnoses     Well adult exam    -  Primary       Patient was using [employer] successfully.  But it is out of stock so he has not been able to get his hands on it recently.  Patient is going to check his blood pressure for 2 weeks every day and send me the blood pressures and if still running up we will increase the amlodipine to 10 mg. Follow up plan: Return in about 6 months (around 12/14/2022), or if symptoms worsen or fail to improve, for Hypertension recheck.  Counseling provided for all of the vaccine components No orders of the defined types were placed in this encounter.   Caryl Pina, MD [facility] 06/14/2022, 3:06 PM

## 2022-06-15 LAB — CBC WITH DIFFERENTIAL/PLATELET
Basophils Absolute: 0.1 10*3/uL (ref 0.0–0.2)
Basos: 0 %
EOS (ABSOLUTE): 0.3 10*3/uL (ref 0.0–0.4)
Eos: 3 %
Hematocrit: 48.5 % (ref 37.5–51.0)
Hemoglobin: 15.6 g/dL (ref 13.0–17.7)
Immature Grans (Abs): 0 10*3/uL (ref 0.0–0.1)
Immature Granulocytes: 0 %
Lymphocytes Absolute: 1.6 10*3/uL (ref 0.7–3.1)
Lymphs: 15 %
MCH: 27.1 pg (ref 26.6–33.0)
MCHC: 32.2 g/dL (ref 31.5–35.7)
MCV: 84 fL (ref 79–97)
Monocytes Absolute: 0.7 10*3/uL (ref 0.1–0.9)
Monocytes: 6 %
Neutrophils Absolute: 8.5 10*3/uL — ABNORMAL HIGH (ref 1.4–7.0)
Neutrophils: 76 %
Platelets: 313 10*3/uL (ref 150–450)
RBC: 5.75 x10E6/uL (ref 4.14–5.80)
RDW: 13.1 % (ref 11.6–15.4)
WBC: 11.3 10*3/uL — ABNORMAL HIGH (ref 3.4–10.8)

## 2022-06-15 LAB — TSH: TSH: 1.15 u[IU]/mL (ref 0.450–4.500)

## 2022-06-15 LAB — CMP14+EGFR
ALT: 35 IU/L (ref 0–44)
AST: 27 IU/L (ref 0–40)
Albumin/Globulin Ratio: 1.8 (ref 1.2–2.2)
Albumin: 4.5 g/dL (ref 4.1–5.1)
Alkaline Phosphatase: 121 IU/L (ref 44–121)
BUN/Creatinine Ratio: 13 (ref 9–20)
BUN: 10 mg/dL (ref 6–24)
Bilirubin Total: 0.6 mg/dL (ref 0.0–1.2)
CO2: 23 mmol/L (ref 20–29)
Calcium: 9.7 mg/dL (ref 8.7–10.2)
Chloride: 98 mmol/L (ref 96–106)
Creatinine, Ser: 0.75 mg/dL — ABNORMAL LOW (ref 0.76–1.27)
Globulin, Total: 2.5 g/dL (ref 1.5–4.5)
Glucose: 81 mg/dL (ref 70–99)
Potassium: 4.2 mmol/L (ref 3.5–5.2)
Sodium: 137 mmol/L (ref 134–144)
Total Protein: 7 g/dL (ref 6.0–8.5)
eGFR: 114 mL/min/{1.73_m2} (ref >59)

## 2022-06-15 LAB — LIPID PANEL
Chol/HDL Ratio: 4.6 ratio (ref 0.0–5.0)
Cholesterol, Total: 161 mg/dL (ref 100–199)
HDL: 35 mg/dL — ABNORMAL LOW (ref >39)
LDL Chol Calc (NIH): 100 mg/dL — ABNORMAL HIGH (ref 0–99)
Triglycerides: 147 mg/dL (ref 0–149)
VLDL Cholesterol Cal: 26 mg/dL (ref 5–40)

## 2022-07-01 DIAGNOSIS — J40 Bronchitis, not specified as acute or chronic: Secondary | ICD-10-CM | POA: Diagnosis not present

## 2022-07-01 DIAGNOSIS — J329 Chronic sinusitis, unspecified: Secondary | ICD-10-CM | POA: Diagnosis not present

## 2022-07-11 ENCOUNTER — Ambulatory Visit: Payer: BC Managed Care – PPO | Admitting: Family Medicine

## 2022-07-11 ENCOUNTER — Encounter: Payer: BC Managed Care – PPO | Admitting: Family Medicine

## 2022-07-11 DIAGNOSIS — H01026 Squamous blepharitis left eye, unspecified eyelid: Secondary | ICD-10-CM | POA: Diagnosis not present

## 2022-07-11 DIAGNOSIS — H01023 Squamous blepharitis right eye, unspecified eyelid: Secondary | ICD-10-CM | POA: Diagnosis not present

## 2022-07-11 DIAGNOSIS — H40013 Open angle with borderline findings, low risk, bilateral: Secondary | ICD-10-CM | POA: Diagnosis not present

## 2022-07-24 DIAGNOSIS — E890 Postprocedural hypothyroidism: Secondary | ICD-10-CM | POA: Diagnosis not present

## 2022-07-24 DIAGNOSIS — I10 Essential (primary) hypertension: Secondary | ICD-10-CM | POA: Diagnosis not present

## 2022-07-24 DIAGNOSIS — C73 Malignant neoplasm of thyroid gland: Secondary | ICD-10-CM | POA: Diagnosis not present

## 2022-08-11 ENCOUNTER — Other Ambulatory Visit: Payer: Self-pay | Admitting: Family Medicine

## 2022-08-11 DIAGNOSIS — E039 Hypothyroidism, unspecified: Secondary | ICD-10-CM

## 2022-08-11 DIAGNOSIS — Z85850 Personal history of malignant neoplasm of thyroid: Secondary | ICD-10-CM

## 2022-10-23 DIAGNOSIS — C73 Malignant neoplasm of thyroid gland: Secondary | ICD-10-CM | POA: Diagnosis not present

## 2022-10-30 DIAGNOSIS — E890 Postprocedural hypothyroidism: Secondary | ICD-10-CM | POA: Diagnosis not present

## 2022-11-18 ENCOUNTER — Other Ambulatory Visit: Payer: Self-pay | Admitting: Family Medicine

## 2022-11-18 DIAGNOSIS — I10 Essential (primary) hypertension: Secondary | ICD-10-CM

## 2022-11-18 DIAGNOSIS — E785 Hyperlipidemia, unspecified: Secondary | ICD-10-CM

## 2022-12-11 DIAGNOSIS — E890 Postprocedural hypothyroidism: Secondary | ICD-10-CM | POA: Diagnosis not present

## 2022-12-14 ENCOUNTER — Ambulatory Visit (INDEPENDENT_AMBULATORY_CARE_PROVIDER_SITE_OTHER): Payer: BC Managed Care – PPO | Admitting: Family Medicine

## 2022-12-14 ENCOUNTER — Encounter: Payer: Self-pay | Admitting: Family Medicine

## 2022-12-14 VITALS — BP 128/72 | HR 55 | Ht 76.0 in | Wt 340.0 lb

## 2022-12-14 DIAGNOSIS — E039 Hypothyroidism, unspecified: Secondary | ICD-10-CM | POA: Diagnosis not present

## 2022-12-14 DIAGNOSIS — I10 Essential (primary) hypertension: Secondary | ICD-10-CM | POA: Diagnosis not present

## 2022-12-14 DIAGNOSIS — E8881 Metabolic syndrome: Secondary | ICD-10-CM

## 2022-12-14 DIAGNOSIS — E785 Hyperlipidemia, unspecified: Secondary | ICD-10-CM | POA: Diagnosis not present

## 2022-12-14 LAB — BAYER DCA HB A1C WAIVED: HB A1C (BAYER DCA - WAIVED): 5.2 % (ref 4.8–5.6)

## 2022-12-14 MED ORDER — ESOMEPRAZOLE MAGNESIUM 40 MG PO CPDR: 40.0000 mg | DELAYED_RELEASE_CAPSULE | Freq: Every day | ORAL | 3 refills | Status: DC

## 2022-12-14 MED ORDER — ATORVASTATIN CALCIUM 20 MG PO TABS: 20.0000 mg | ORAL_TABLET | Freq: Every day | ORAL | 3 refills | Status: DC

## 2022-12-14 MED ORDER — AMLODIPINE BESYLATE 5 MG PO TABS: 5.0000 mg | ORAL_TABLET | Freq: Every day | ORAL | 3 refills | Status: DC

## 2022-12-14 NOTE — Progress Notes (Signed)
BP 128/72   Pulse (!) 55   Ht 6\' 4"  (1.93 m)   Wt (!) 340 lb (154.2 kg)   SpO2 95%   BMI 41.39 kg/m    Subjective:   Patient ID: Jay Lang, male    DOB: 1975-10-26, 47 y.o.   MRN: [id number]  HPI: Braidan Edwardson is a 47 y.o. male presenting on 12/14/2022 for Medical Management of Chronic Issues, Hyperlipidemia, Hypertension, and Hypothyroidism   HPI Hypertension Patient is currently on amlodipine, and their blood pressure today is 128/72. Patient denies any lightheadedness or dizziness. Patient denies headaches, blurred vision, chest pains, shortness of breath, or weakness. Denies any side effects from medication and is content with current medication.   Hyperlipidemia Patient is coming in for recheck of his hyperlipidemia. The patient is currently taking atorvastatin. They deny any issues with myalgias or history of liver damage from it. They deny any focal numbness or weakness or chest pain.   Hypothyroidism recheck Patient is coming in for thyroid recheck today as well. They deny any issues with hair changes or heat or cold problems or diarrhea or constipation. They deny any chest pain or palpitations. They are currently on levothyroxine 137 micrograms   Metabolic syndrome Patient has morbid obesity with metabolic syndrome with a BMI of 41.  He has been working towards weight loss and is down nearly 32 pounds in the past 6 months.  He is working with a program called [facility] in [place] it is helping him a lot.  Relevant past medical, surgical, family and social history reviewed and updated as indicated. Interim medical history since our last visit reviewed. Allergies and medications reviewed and updated.  Review of Systems  Constitutional:  Negative for chills and fever.  HENT:  Negative for ear pain and tinnitus.   Eyes:  Negative for pain and visual disturbance.  Respiratory:  Negative for cough, shortness of breath and wheezing.   Cardiovascular:  Negative for  chest pain, palpitations and leg swelling.  Gastrointestinal:  Negative for abdominal pain, blood in stool, constipation and diarrhea.  Genitourinary:  Negative for dysuria and hematuria.  Musculoskeletal:  Negative for back pain, gait problem and myalgias.  Skin:  Negative for rash.  Neurological:  Negative for dizziness, weakness and headaches.  Psychiatric/Behavioral:  Negative for suicidal ideas.   All other systems reviewed and are negative.   Per HPI unless specifically indicated above   Allergies as of 12/14/2022       Reactions   Penicillins    Rash        Medication List        Accurate as of December 14, 2022 10:56 AM. If you have any questions, ask your nurse or doctor.          amLODipine 5 MG tablet Commonly known as: NORVASC Take 1 tablet (5 mg total) by mouth daily.   atorvastatin 20 MG tablet Commonly known as: LIPITOR Take 1 tablet (20 mg total) by mouth daily.   esomeprazole 40 MG capsule Commonly known as: NexIUM Take 1 capsule (40 mg total) by mouth daily.   levothyroxine 137 MCG tablet Commonly known as: Levoxyl TAKE 2 TABLETS (274 MCG TOTAL) BY MOUTH DAILY.         Objective:   BP 128/72   Pulse (!) 55   Ht 6\' 4"  (1.93 m)   Wt (!) 340 lb (154.2 kg)   SpO2 95%   BMI 41.39 kg/m   Wt Readings from Last 3  Encounters:  12/14/22 (!) 340 lb (154.2 kg)  06/14/22 (!) 372 lb (168.7 kg)  11/08/21 (!) 378 lb (171.5 kg)    Physical Exam Vitals and nursing note reviewed.  Constitutional:      General: He is not in acute distress.    Appearance: He is well-developed. He is not diaphoretic.  Eyes:     General: No scleral icterus.    Conjunctiva/sclera: Conjunctivae normal.  Neck:     Thyroid: No thyromegaly.  Cardiovascular:     Rate and Rhythm: Normal rate and regular rhythm.     Heart sounds: Normal heart sounds. No murmur heard. Pulmonary:     Effort: Pulmonary effort is normal. No respiratory distress.     Breath sounds: Normal  breath sounds. No wheezing.  Musculoskeletal:        General: No swelling. Normal range of motion.     Cervical back: Neck supple.  Lymphadenopathy:     Cervical: No cervical adenopathy.  Skin:    General: Skin is warm and dry.     Findings: No rash.  Neurological:     Mental Status: He is alert and oriented to person, place, and time.     Coordination: Coordination normal.  Psychiatric:        Behavior: Behavior normal.       Assessment & Plan:   Problem List Items Addressed This Visit       Cardiovascular and Mediastinum   Hypertension   Relevant Medications   amLODipine (NORVASC) 5 MG tablet   atorvastatin (LIPITOR) 20 MG tablet   Other Relevant Orders   CBC with Differential/Platelet   CMP14+EGFR   Lipid panel   TSH   Bayer DCA Hb A1c Waived     Endocrine   Hypothyroid - Primary   Relevant Orders   CBC with Differential/Platelet   CMP14+EGFR   Lipid panel   TSH   Bayer DCA Hb A1c Waived     Other   Dyslipidemia   Relevant Medications   atorvastatin (LIPITOR) 20 MG tablet   Other Relevant Orders   CBC with Differential/Platelet   CMP14+EGFR   Lipid panel   TSH   Bayer DCA Hb A1c Waived   Metabolic syndrome   Relevant Orders   CBC with Differential/Platelet   CMP14+EGFR   Lipid panel   TSH   Bayer DCA Hb A1c Waived   Other Visit Diagnoses     Essential hypertension       Relevant Medications   amLODipine (NORVASC) 5 MG tablet   atorvastatin (LIPITOR) 20 MG tablet   Other Relevant Orders   CBC with Differential/Platelet   CMP14+EGFR   Lipid panel   TSH   Bayer DCA Hb A1c Waived       Will check blood work today for metabolic syndrome, he has been losing weight and making good lifestyle changes.  Encouraged to keep going. Follow up plan: Return in about 6 months (around 06/15/2023), or if symptoms worsen or fail to improve, for Physical exam and recheck hypertension and cholesterol and thyroid.  Counseling provided for all of the  vaccine components Orders Placed This Encounter  Procedures   CBC with Differential/Platelet   CMP14+EGFR   Lipid panel   TSH   Bayer DCA Hb A1c Waived    Arville Care, MD [facility] 12/14/2022, 10:56 AM

## 2022-12-15 LAB — CMP14+EGFR
ALT: 24 IU/L (ref 0–44)
AST: 20 IU/L (ref 0–40)
Albumin/Globulin Ratio: 2
Albumin: 4.5 g/dL (ref 4.1–5.1)
Alkaline Phosphatase: 115 IU/L (ref 44–121)
BUN/Creatinine Ratio: 20 (ref 9–20)
BUN: 17 mg/dL (ref 6–24)
Bilirubin Total: 0.5 mg/dL (ref 0.0–1.2)
CO2: 24 mmol/L (ref 20–29)
Calcium: 9.5 mg/dL (ref 8.7–10.2)
Chloride: 98 mmol/L (ref 96–106)
Creatinine, Ser: 0.83 mg/dL (ref 0.76–1.27)
Globulin, Total: 2.3 g/dL (ref 1.5–4.5)
Glucose: 80 mg/dL (ref 70–99)
Potassium: 4.7 mmol/L (ref 3.5–5.2)
Sodium: 136 mmol/L (ref 134–144)
Total Protein: 6.8 g/dL (ref 6.0–8.5)
eGFR: 110 mL/min/{1.73_m2} (ref >59)

## 2022-12-15 LAB — CBC WITH DIFFERENTIAL/PLATELET
Basophils Absolute: 0.1 10*3/uL (ref 0.0–0.2)
Basos: 1 %
EOS (ABSOLUTE): 0.3 10*3/uL (ref 0.0–0.4)
Eos: 3 %
Hematocrit: 49.2 % (ref 37.5–51.0)
Hemoglobin: 16 g/dL (ref 13.0–17.7)
Immature Grans (Abs): 0 10*3/uL (ref 0.0–0.1)
Immature Granulocytes: 0 %
Lymphocytes Absolute: 1.4 10*3/uL (ref 0.7–3.1)
Lymphs: 15 %
MCH: 26.4 pg — ABNORMAL LOW (ref 26.6–33.0)
MCHC: 32.5 g/dL (ref 31.5–35.7)
MCV: 81 fL (ref 79–97)
Monocytes Absolute: 0.7 10*3/uL (ref 0.1–0.9)
Monocytes: 7 %
Neutrophils Absolute: 6.8 10*3/uL (ref 1.4–7.0)
Neutrophils: 74 %
Platelets: 303 10*3/uL (ref 150–450)
RBC: 6.07 x10E6/uL — ABNORMAL HIGH (ref 4.14–5.80)
RDW: 13.9 % (ref 11.6–15.4)
WBC: 9.3 10*3/uL (ref 3.4–10.8)

## 2022-12-15 LAB — TSH: TSH: 5.38 u[IU]/mL — ABNORMAL HIGH (ref 0.450–4.500)

## 2022-12-15 LAB — LIPID PANEL
Chol/HDL Ratio: 3.5 ratio (ref 0.0–5.0)
Cholesterol, Total: 119 mg/dL (ref 100–199)
HDL: 34 mg/dL — ABNORMAL LOW (ref >39)
LDL Chol Calc (NIH): 67 mg/dL (ref 0–99)
Triglycerides: 90 mg/dL (ref 0–149)
VLDL Cholesterol Cal: 18 mg/dL (ref 5–40)

## 2023-02-06 DIAGNOSIS — C73 Malignant neoplasm of thyroid gland: Secondary | ICD-10-CM | POA: Diagnosis not present

## 2023-02-06 DIAGNOSIS — E890 Postprocedural hypothyroidism: Secondary | ICD-10-CM | POA: Diagnosis not present

## 2023-02-06 DIAGNOSIS — E7800 Pure hypercholesterolemia, unspecified: Secondary | ICD-10-CM | POA: Diagnosis not present

## 2023-02-06 DIAGNOSIS — I10 Essential (primary) hypertension: Secondary | ICD-10-CM | POA: Diagnosis not present

## 2023-06-11 DIAGNOSIS — E890 Postprocedural hypothyroidism: Secondary | ICD-10-CM | POA: Diagnosis not present

## 2023-06-14 ENCOUNTER — Ambulatory Visit (INDEPENDENT_AMBULATORY_CARE_PROVIDER_SITE_OTHER): Payer: BC Managed Care – PPO | Admitting: Family Medicine

## 2023-06-14 ENCOUNTER — Encounter: Payer: Self-pay | Admitting: Family Medicine

## 2023-06-14 VITALS — BP 135/81 | HR 57 | Ht 76.0 in | Wt 311.0 lb

## 2023-06-14 DIAGNOSIS — E039 Hypothyroidism, unspecified: Secondary | ICD-10-CM | POA: Diagnosis not present

## 2023-06-14 DIAGNOSIS — Z1211 Encounter for screening for malignant neoplasm of colon: Secondary | ICD-10-CM

## 2023-06-14 DIAGNOSIS — I10 Essential (primary) hypertension: Secondary | ICD-10-CM

## 2023-06-14 DIAGNOSIS — E119 Type 2 diabetes mellitus without complications: Secondary | ICD-10-CM

## 2023-06-14 DIAGNOSIS — E785 Hyperlipidemia, unspecified: Secondary | ICD-10-CM | POA: Diagnosis not present

## 2023-06-14 DIAGNOSIS — Z23 Encounter for immunization: Secondary | ICD-10-CM | POA: Diagnosis not present

## 2023-06-14 LAB — LIPID PANEL

## 2023-06-14 LAB — BAYER DCA HB A1C WAIVED: HB A1C (BAYER DCA - WAIVED): 5 % (ref 4.8–5.6)

## 2023-06-14 MED ORDER — LEVOTHYROXINE SODIUM 137 MCG PO TABS: ORAL_TABLET | ORAL | 3 refills | Status: DC

## 2023-06-14 NOTE — Progress Notes (Signed)
BP 135/81   Pulse (!) 57   Ht 6\' 4"  (1.93 m)   Wt (!) 311 lb (141.1 kg)   SpO2 97%   BMI 37.86 kg/m    Subjective:   Patient ID: Jay Lang, male    DOB: August 18, 1975, 47 y.o.   MRN: [id number]  HPI: Jay Lang is a 47 y.o. male presenting on 06/14/2023 for Medical Management of Chronic Issues, Hypothyroidism, and Diabetes   HPI Physical exam Patient denies any chest pain, shortness of breath, headaches or vision issues, abdominal complaints, diarrhea, nausea, vomiting, or joint issues.   Type 2 diabetes mellitus Patient comes in today for recheck of his diabetes. Patient has been currently taking diet control and doing well. Patient is not currently on an ACE inhibitor/ARB. Patient has not seen an ophthalmologist this year. Patient denies any new issues with their feet. The symptom started onset as an adult hypertension and hyperlipidemia and hypothyroidism ARE RELATED TO DM   Hypertension Patient is currently on amlodipine although is not been taking it consistently, and their blood pressure today is 135/81.  Patient had an episode earlier in the year where he was hot and sweaty and doing exercise hiking and felt a little faint dizzy and was during the summertime.  He had 1 other episode similar after Thanksgiving when he was standing for long period time and the line and then felt a little faint and passed quickly once he ate something and drink some fluids.  He denied any chest pain or any issues since. Patient denies headaches, blurred vision, chest pains, shortness of breath, or weakness. Denies any side effects from medication and is content with current medication.   Hypothyroidism recheck Patient is coming in for thyroid recheck today as well. They deny any issues with hair changes or heat or cold problems or diarrhea or constipation. They deny any chest pain or palpitations. They are currently on levothyroxine 137 micrograms   Hyperlipidemia Patient is coming in for recheck of  his hyperlipidemia. The patient is currently taking atorvastatin. They deny any issues with myalgias or history of liver damage from it. They deny any focal numbness or weakness or chest pain.   Relevant past medical, surgical, family and social history reviewed and updated as indicated. Interim medical history since our last visit reviewed. Allergies and medications reviewed and updated.  Review of Systems  Constitutional:  Negative for chills and fever.  HENT:  Negative for ear pain and tinnitus.   Eyes:  Negative for pain and discharge.  Respiratory:  Negative for cough, shortness of breath and wheezing.   Cardiovascular:  Negative for chest pain, palpitations and leg swelling.  Gastrointestinal:  Negative for abdominal pain, blood in stool, constipation and diarrhea.  Genitourinary:  Negative for dysuria and hematuria.  Musculoskeletal:  Negative for back pain, gait problem and myalgias.  Skin:  Negative for rash.  Neurological:  Negative for dizziness, weakness and headaches.  Psychiatric/Behavioral:  Negative for suicidal ideas.   All other systems reviewed and are negative.   Per HPI unless specifically indicated above   Allergies as of 06/14/2023       Reactions   Penicillins    Rash        Medication List        Accurate as of June 14, 2023  8:43 AM. If you have any questions, ask your nurse or doctor.          amLODipine 5 MG tablet Commonly known as: NORVASC Take  1 tablet (5 mg total) by mouth daily.   atorvastatin 20 MG tablet Commonly known as: LIPITOR Take 1 tablet (20 mg total) by mouth daily.   esomeprazole 40 MG capsule Commonly known as: NexIUM Take 1 capsule (40 mg total) by mouth daily.   levothyroxine 137 MCG tablet Commonly known as: Levoxyl TAKE 2 TABLETS (274 MCG TOTAL) BY MOUTH DAILY. EXCEPT ON SUNDAY TAKE 3 TABLETS         Objective:   BP 135/81   Pulse (!) 57   Ht 6\' 4"  (1.93 m)   Wt (!) 311 lb (141.1 kg)   SpO2 97%    BMI 37.86 kg/m   Wt Readings from Last 3 Encounters:  06/14/23 (!) 311 lb (141.1 kg)  12/14/22 (!) 340 lb (154.2 kg)  06/14/22 (!) 372 lb (168.7 kg)    Physical Exam Vitals reviewed.  Constitutional:      General: He is not in acute distress.    Appearance: He is well-developed. He is not diaphoretic.  HENT:     Right Ear: External ear normal.     Left Ear: External ear normal.     Nose: Nose normal.     Mouth/Throat:     Pharynx: No oropharyngeal exudate.  Eyes:     General: No scleral icterus.    Conjunctiva/sclera: Conjunctivae normal.  Neck:     Thyroid: No thyromegaly.  Cardiovascular:     Rate and Rhythm: Normal rate and regular rhythm.     Heart sounds: Normal heart sounds. No murmur heard. Pulmonary:     Effort: Pulmonary effort is normal. No respiratory distress.     Breath sounds: Normal breath sounds. No wheezing.  Abdominal:     General: Bowel sounds are normal. There is no distension.     Palpations: Abdomen is soft.     Tenderness: There is no abdominal tenderness. There is no guarding or rebound.  Musculoskeletal:        General: Normal range of motion.     Cervical back: Neck supple.  Lymphadenopathy:     Cervical: No cervical adenopathy.  Skin:    General: Skin is warm and dry.     Findings: No rash.  Neurological:     Mental Status: He is alert and oriented to person, place, and time.     Coordination: Coordination normal.  Psychiatric:        Behavior: Behavior normal.       Assessment & Plan:   Problem List Items Addressed This Visit       Cardiovascular and Mediastinum   Essential hypertension - Primary   Relevant Orders   CBC with Differential/Platelet   CMP14+EGFR   Lipid panel   TSH   Bayer DCA Hb A1c Waived     Endocrine   Hypothyroid   Relevant Medications   levothyroxine (LEVOXYL) 137 MCG tablet   Other Relevant Orders   CBC with Differential/Platelet   CMP14+EGFR   Lipid panel   TSH   Bayer DCA Hb A1c Waived      Other   Dyslipidemia   Relevant Orders   CBC with Differential/Platelet   CMP14+EGFR   Lipid panel   TSH   Bayer DCA Hb A1c Waived   Other Visit Diagnoses       Controlled type 2 diabetes mellitus without complication, without long-term current use of insulin (HCC)       Relevant Orders   CBC with Differential/Platelet   CMP14+EGFR   Lipid panel  TSH   Bayer DCA Hb A1c Waived     Colon cancer screening       Relevant Orders   Ambulatory referral to Gastroenterology       A1c looks good at 5.0.  Doing well otherwise, will do referral to gastroenterology.  Do screening for PSA in the future. Follow up plan: Return in about 6 months (around 12/13/2023), or if symptoms worsen or fail to improve, for Hypertension and thyroid and cholesterol.  Counseling provided for all of the vaccine components Orders Placed This Encounter  Procedures   CBC with Differential/Platelet   CMP14+EGFR   Lipid panel   TSH   Bayer DCA Hb A1c Waived   Ambulatory referral to Gastroenterology    Arville Care, MD [facility] 06/14/2023, 8:43 AM

## 2023-06-15 LAB — CBC WITH DIFFERENTIAL/PLATELET
Basophils Absolute: 0 10*3/uL (ref 0.0–0.2)
Basos: 1 %
EOS (ABSOLUTE): 0.3 10*3/uL (ref 0.0–0.4)
Eos: 4 %
Hematocrit: 50.5 % (ref 37.5–51.0)
Hemoglobin: 16.3 g/dL (ref 13.0–17.7)
Immature Grans (Abs): 0 10*3/uL (ref 0.0–0.1)
Immature Granulocytes: 0 %
Lymphocytes Absolute: 1.5 10*3/uL (ref 0.7–3.1)
Lymphs: 20 %
MCH: 27.4 pg (ref 26.6–33.0)
MCHC: 32.3 g/dL (ref 31.5–35.7)
MCV: 85 fL (ref 79–97)
Monocytes Absolute: 0.7 10*3/uL (ref 0.1–0.9)
Monocytes: 9 %
Neutrophils Absolute: 5.1 10*3/uL (ref 1.4–7.0)
Neutrophils: 66 %
Platelets: 322 10*3/uL (ref 150–450)
RBC: 5.94 x10E6/uL — ABNORMAL HIGH (ref 4.14–5.80)
RDW: 13.2 % (ref 11.6–15.4)
WBC: 7.7 10*3/uL (ref 3.4–10.8)

## 2023-06-15 LAB — CMP14+EGFR
ALT: 35 IU/L (ref 0–44)
AST: 20 IU/L (ref 0–40)
Albumin: 4.2 g/dL (ref 4.1–5.1)
Alkaline Phosphatase: 94 IU/L (ref 44–121)
BUN/Creatinine Ratio: 14 (ref 9–20)
BUN: 12 mg/dL (ref 6–24)
Bilirubin Total: 0.6 mg/dL (ref 0.0–1.2)
CO2: 24 mmol/L (ref 20–29)
Calcium: 9.1 mg/dL (ref 8.7–10.2)
Chloride: 102 mmol/L (ref 96–106)
Creatinine, Ser: 0.84 mg/dL (ref 0.76–1.27)
Globulin, Total: 2.2 g/dL (ref 1.5–4.5)
Glucose: 81 mg/dL (ref 70–99)
Potassium: 4.5 mmol/L (ref 3.5–5.2)
Sodium: 140 mmol/L (ref 134–144)
Total Protein: 6.4 g/dL (ref 6.0–8.5)
eGFR: 110 mL/min/{1.73_m2} (ref >59)

## 2023-06-15 LAB — LIPID PANEL
Cholesterol, Total: 133 mg/dL (ref 100–199)
HDL: 36 mg/dL — ABNORMAL LOW (ref >39)
LDL CALC COMMENT:: 3.7 ratio (ref 0.0–5.0)
LDL Chol Calc (NIH): 79 mg/dL (ref 0–99)
Triglycerides: 94 mg/dL (ref 0–149)
VLDL Cholesterol Cal: 18 mg/dL (ref 5–40)

## 2023-06-15 LAB — TSH: TSH: 1.28 u[IU]/mL (ref 0.450–4.500)

## 2023-06-17 ENCOUNTER — Encounter: Payer: BC Managed Care – PPO | Admitting: Family Medicine

## 2023-06-17 DIAGNOSIS — Z23 Encounter for immunization: Secondary | ICD-10-CM | POA: Diagnosis not present

## 2023-06-17 DIAGNOSIS — I10 Essential (primary) hypertension: Secondary | ICD-10-CM | POA: Diagnosis not present

## 2023-06-18 DIAGNOSIS — C73 Malignant neoplasm of thyroid gland: Secondary | ICD-10-CM | POA: Diagnosis not present

## 2023-06-18 DIAGNOSIS — E890 Postprocedural hypothyroidism: Secondary | ICD-10-CM | POA: Diagnosis not present

## 2023-10-17 DIAGNOSIS — E890 Postprocedural hypothyroidism: Secondary | ICD-10-CM | POA: Diagnosis not present

## 2023-10-24 DIAGNOSIS — E890 Postprocedural hypothyroidism: Secondary | ICD-10-CM | POA: Diagnosis not present

## 2023-10-24 DIAGNOSIS — E7800 Pure hypercholesterolemia, unspecified: Secondary | ICD-10-CM | POA: Diagnosis not present

## 2023-10-24 DIAGNOSIS — I10 Essential (primary) hypertension: Secondary | ICD-10-CM | POA: Diagnosis not present

## 2023-11-14 ENCOUNTER — Other Ambulatory Visit: Payer: Self-pay | Admitting: Family Medicine

## 2023-12-16 ENCOUNTER — Encounter: Payer: Self-pay | Admitting: Family Medicine

## 2023-12-16 ENCOUNTER — Ambulatory Visit (INDEPENDENT_AMBULATORY_CARE_PROVIDER_SITE_OTHER): Payer: BC Managed Care – PPO | Admitting: Family Medicine

## 2023-12-16 VITALS — BP 127/75 | HR 54 | Temp 97.5°F | Ht 75.0 in | Wt 321.5 lb

## 2023-12-16 DIAGNOSIS — E039 Hypothyroidism, unspecified: Secondary | ICD-10-CM | POA: Diagnosis not present

## 2023-12-16 DIAGNOSIS — I10 Essential (primary) hypertension: Secondary | ICD-10-CM | POA: Diagnosis not present

## 2023-12-16 DIAGNOSIS — E785 Hyperlipidemia, unspecified: Secondary | ICD-10-CM

## 2023-12-16 LAB — BAYER DCA HB A1C WAIVED: HB A1C (BAYER DCA - WAIVED): 4.9 % (ref 4.8–5.6)

## 2023-12-16 MED ORDER — AMLODIPINE BESYLATE 5 MG PO TABS: 5.0000 mg | ORAL_TABLET | Freq: Every day | ORAL | 3 refills | Status: AC

## 2023-12-16 MED ORDER — ATORVASTATIN CALCIUM 20 MG PO TABS: 20.0000 mg | ORAL_TABLET | Freq: Every day | ORAL | 3 refills | Status: AC

## 2023-12-16 MED ORDER — ESOMEPRAZOLE MAGNESIUM 40 MG PO CPDR: 40.0000 mg | DELAYED_RELEASE_CAPSULE | Freq: Every day | ORAL | 3 refills | Status: AC

## 2023-12-16 NOTE — Progress Notes (Signed)
 BP 127/75   Pulse (!) 54   Temp (!) 97.5 F (36.4 C)   Ht 6' 3 (1.905 m)   Wt (!) 321 lb 8 oz (145.8 kg)   SpO2 94%   BMI 40.18 kg/m    Subjective:   Patient ID: Jay Lang, male    DOB: 06-05-1976, 48 y.o.   MRN: [id number]  HPI: Jay Lang is a 48 y.o. male presenting on 12/16/2023 for Medical Management of Chronic Issues   HPI Hypertension Patient is currently on amlodipine , and their blood pressure today is 125/75. Patient denies any lightheadedness or dizziness. Patient denies headaches, blurred vision, chest pains, shortness of breath, or weakness. Denies any side effects from medication and is content with current medication.   Hypothyroidism recheck Patient is coming in for thyroid  recheck today as well. They deny any issues with hair changes or heat or cold problems or diarrhea or constipation. They deny any chest pain or palpitations. They are currently on levothyroxine  137 micrograms   Hyperlipidemia Patient is coming in for recheck of his hyperlipidemia. The patient is currently taking atorvastatin . They deny any issues with myalgias or history of liver damage from it. They deny any focal numbness or weakness or chest pain.   Patient's hypertension are more complicated by the patient's morbid obesity.  Discussed weight loss and lifestyle modification and exercise with the patient.   Relevant past medical, surgical, family and social history reviewed and updated as indicated. Interim medical history since our last visit reviewed. Allergies and medications reviewed and updated.  Review of Systems  Constitutional:  Negative for chills and fever.  Eyes:  Negative for visual disturbance.  Respiratory:  Negative for shortness of breath and wheezing.   Cardiovascular:  Negative for chest pain and leg swelling.  Musculoskeletal:  Negative for back pain and gait problem.  Skin:  Negative for rash.  Neurological:  Negative for dizziness and light-headedness.  All other  systems reviewed and are negative.   Per HPI unless specifically indicated above   Allergies as of 12/16/2023       Reactions   Penicillins    Rash        Medication List        Accurate as of December 16, 2023  8:37 AM. If you have any questions, ask your nurse or doctor.          amLODipine  5 MG tablet Commonly known as: NORVASC  Take 1 tablet (5 mg total) by mouth daily.   atorvastatin  20 MG tablet Commonly known as: LIPITOR Take 1 tablet (20 mg total) by mouth daily.   esomeprazole  40 MG capsule Commonly known as: NEXIUM  Take 1 capsule (40 mg total) by mouth daily.   levothyroxine  137 MCG tablet Commonly known as: Levoxyl  TAKE 2 TABLETS (274 MCG TOTAL) BY MOUTH DAILY. EXCEPT ON SUNDAY TAKE 3 TABLETS         Objective:   BP 127/75   Pulse (!) 54   Temp (!) 97.5 F (36.4 C)   Ht 6' 3 (1.905 m)   Wt (!) 321 lb 8 oz (145.8 kg)   SpO2 94%   BMI 40.18 kg/m   Wt Readings from Last 3 Encounters:  12/16/23 (!) 321 lb 8 oz (145.8 kg)  06/14/23 (!) 311 lb (141.1 kg)  12/14/22 (!) 340 lb (154.2 kg)    Physical Exam Vitals and nursing note reviewed.  Constitutional:      General: He is not in acute distress.  Appearance: He is well-developed. He is not diaphoretic.   Eyes:     General: No scleral icterus.    Conjunctiva/sclera: Conjunctivae normal.   Neck:     Thyroid : No thyromegaly.   Cardiovascular:     Rate and Rhythm: Normal rate and regular rhythm.     Heart sounds: Normal heart sounds. No murmur heard. Pulmonary:     Effort: Pulmonary effort is normal. No respiratory distress.     Breath sounds: Normal breath sounds. No wheezing.   Musculoskeletal:        General: No swelling. Normal range of motion.     Cervical back: Neck supple.  Lymphadenopathy:     Cervical: No cervical adenopathy.   Skin:    General: Skin is warm and dry.     Findings: No rash.   Neurological:     Mental Status: He is alert and oriented to person, place,  and time.     Coordination: Coordination normal.   Psychiatric:        Behavior: Behavior normal.       Assessment & Plan:   Problem List Items Addressed This Visit       Cardiovascular and Mediastinum   Essential hypertension   Relevant Medications   amLODipine  (NORVASC ) 5 MG tablet   atorvastatin  (LIPITOR) 20 MG tablet   Other Relevant Orders   CMP14+EGFR   CBC with Differential/Platelet   Lipid panel   TSH     Endocrine   Hypothyroid   Relevant Orders   CMP14+EGFR   CBC with Differential/Platelet   Lipid panel   TSH     Other   Dyslipidemia - Primary   Relevant Medications   atorvastatin  (LIPITOR) 20 MG tablet   Other Relevant Orders   CMP14+EGFR   CBC with Differential/Platelet   Lipid panel   TSH   Morbid obesity (HCC)   Relevant Orders   CMP14+EGFR   CBC with Differential/Platelet   Lipid panel   TSH   Bayer DCA Hb A1c Waived  Will check blood work today, his recheck, blood pressure was good at 125/75, continue to keep an eye on it every now and then.  Encouraged to go for colonoscopy.  Patient had some jock itch and is using Lamisil cream just encouraged him to use it twice a day and use a moisturizer with it and change colors with every get sweaty.  He is going to call for colonoscopy.  Follow up plan: Return in about 6 months (around 06/16/2024), or if symptoms worsen or fail to improve, for Physical exam.  Counseling provided for all of the vaccine components Orders Placed This Encounter  Procedures   CMP14+EGFR   CBC with Differential/Platelet   Lipid panel   TSH   Bayer DCA Hb A1c Waived    Jolyne Needs, MD [facility] 12/16/2023, 8:37 AM

## 2023-12-17 LAB — CMP14+EGFR
ALT: 24 IU/L (ref 0–44)
AST: 19 IU/L (ref 0–40)
Albumin: 4.3 g/dL (ref 4.1–5.1)
Alkaline Phosphatase: 78 IU/L (ref 44–121)
BUN/Creatinine Ratio: 22 — ABNORMAL HIGH (ref 9–20)
BUN: 15 mg/dL (ref 6–24)
Bilirubin Total: 0.3 mg/dL (ref 0.0–1.2)
CO2: 20 mmol/L (ref 20–29)
Calcium: 9.3 mg/dL (ref 8.7–10.2)
Chloride: 102 mmol/L (ref 96–106)
Creatinine, Ser: 0.69 mg/dL — ABNORMAL LOW (ref 0.76–1.27)
Globulin, Total: 2.1 g/dL (ref 1.5–4.5)
Glucose: 85 mg/dL (ref 70–99)
Potassium: 4.6 mmol/L (ref 3.5–5.2)
Sodium: 137 mmol/L (ref 134–144)
Total Protein: 6.4 g/dL (ref 6.0–8.5)
eGFR: 116 mL/min/{1.73_m2} (ref >59)

## 2023-12-17 LAB — CBC WITH DIFFERENTIAL/PLATELET
Basophils Absolute: 0.1 10*3/uL (ref 0.0–0.2)
Basos: 1 %
EOS (ABSOLUTE): 0.3 10*3/uL (ref 0.0–0.4)
Eos: 5 %
Hematocrit: 49.5 % (ref 37.5–51.0)
Hemoglobin: 15.5 g/dL (ref 13.0–17.7)
Immature Grans (Abs): 0 10*3/uL (ref 0.0–0.1)
Immature Granulocytes: 0 %
Lymphocytes Absolute: 1.4 10*3/uL (ref 0.7–3.1)
Lymphs: 22 %
MCH: 27.5 pg (ref 26.6–33.0)
MCHC: 31.3 g/dL — ABNORMAL LOW (ref 31.5–35.7)
MCV: 88 fL (ref 79–97)
Monocytes Absolute: 0.5 10*3/uL (ref 0.1–0.9)
Monocytes: 8 %
Neutrophils Absolute: 4.1 10*3/uL (ref 1.4–7.0)
Neutrophils: 64 %
Platelets: 284 10*3/uL (ref 150–450)
RBC: 5.63 x10E6/uL (ref 4.14–5.80)
RDW: 12.8 % (ref 11.6–15.4)
WBC: 6.4 10*3/uL (ref 3.4–10.8)

## 2023-12-17 LAB — LIPID PANEL
Chol/HDL Ratio: 3.6 ratio (ref 0.0–5.0)
Cholesterol, Total: 128 mg/dL (ref 100–199)
HDL: 36 mg/dL — ABNORMAL LOW (ref >39)
LDL Chol Calc (NIH): 76 mg/dL (ref 0–99)
Triglycerides: 83 mg/dL (ref 0–149)
VLDL Cholesterol Cal: 16 mg/dL (ref 5–40)

## 2023-12-17 LAB — TSH: TSH: 0.873 u[IU]/mL (ref 0.450–4.500)

## 2023-12-20 ENCOUNTER — Ambulatory Visit: Payer: Self-pay | Admitting: Family Medicine

## 2024-01-28 DIAGNOSIS — E890 Postprocedural hypothyroidism: Secondary | ICD-10-CM | POA: Diagnosis not present

## 2024-02-04 DIAGNOSIS — R7301 Impaired fasting glucose: Secondary | ICD-10-CM | POA: Diagnosis not present

## 2024-02-04 DIAGNOSIS — E890 Postprocedural hypothyroidism: Secondary | ICD-10-CM | POA: Diagnosis not present

## 2024-02-04 DIAGNOSIS — C73 Malignant neoplasm of thyroid gland: Secondary | ICD-10-CM | POA: Diagnosis not present

## 2024-06-19 ENCOUNTER — Ambulatory Visit: Payer: Self-pay | Admitting: Family Medicine

## 2024-06-19 ENCOUNTER — Encounter: Payer: Self-pay | Admitting: Family Medicine

## 2024-06-19 VITALS — BP 129/71 | HR 54 | Ht 75.0 in | Wt 317.0 lb

## 2024-06-19 DIAGNOSIS — Z0001 Encounter for general adult medical examination with abnormal findings: Secondary | ICD-10-CM | POA: Diagnosis not present

## 2024-06-19 DIAGNOSIS — E039 Hypothyroidism, unspecified: Secondary | ICD-10-CM | POA: Diagnosis not present

## 2024-06-19 DIAGNOSIS — E785 Hyperlipidemia, unspecified: Secondary | ICD-10-CM | POA: Diagnosis not present

## 2024-06-19 DIAGNOSIS — I10 Essential (primary) hypertension: Secondary | ICD-10-CM | POA: Diagnosis not present

## 2024-06-19 DIAGNOSIS — Z0000 Encounter for general adult medical examination without abnormal findings: Secondary | ICD-10-CM | POA: Diagnosis not present

## 2024-06-19 DIAGNOSIS — Z23 Encounter for immunization: Secondary | ICD-10-CM | POA: Diagnosis not present

## 2024-06-19 DIAGNOSIS — Z1211 Encounter for screening for malignant neoplasm of colon: Secondary | ICD-10-CM | POA: Diagnosis not present

## 2024-06-19 DIAGNOSIS — L209 Atopic dermatitis, unspecified: Secondary | ICD-10-CM | POA: Diagnosis not present

## 2024-06-19 LAB — CMP14+EGFR
ALT: 32 IU/L (ref 0–44)
AST: 20 IU/L (ref 0–40)
Albumin: 4.2 g/dL (ref 4.1–5.1)
Alkaline Phosphatase: 92 IU/L (ref 47–123)
BUN/Creatinine Ratio: 18 (ref 9–20)
BUN: 14 mg/dL (ref 6–24)
Bilirubin Total: 0.4 mg/dL (ref 0.0–1.2)
CO2: 21 mmol/L (ref 20–29)
Calcium: 9.5 mg/dL (ref 8.7–10.2)
Chloride: 103 mmol/L (ref 96–106)
Creatinine, Ser: 0.77 mg/dL (ref 0.76–1.27)
Globulin, Total: 2 g/dL (ref 1.5–4.5)
Glucose: 74 mg/dL (ref 70–99)
Potassium: 4.4 mmol/L (ref 3.5–5.2)
Sodium: 139 mmol/L (ref 134–144)
Total Protein: 6.2 g/dL (ref 6.0–8.5)
eGFR: 112 mL/min/1.73

## 2024-06-19 LAB — CBC WITH DIFF/PLATELET
Basophils Absolute: 0.1 x10E3/uL (ref 0.0–0.2)
Basos: 1 %
EOS (ABSOLUTE): 0.3 x10E3/uL (ref 0.0–0.4)
Eos: 4 %
Hematocrit: 48.7 % (ref 37.5–51.0)
Hemoglobin: 16.1 g/dL (ref 13.0–17.7)
Immature Grans (Abs): 0 x10E3/uL (ref 0.0–0.1)
Immature Granulocytes: 0 %
Lymphocytes Absolute: 1.4 x10E3/uL (ref 0.7–3.1)
Lymphs: 22 %
MCH: 28.1 pg (ref 26.6–33.0)
MCHC: 33.1 g/dL (ref 31.5–35.7)
MCV: 85 fL (ref 79–97)
Monocytes Absolute: 0.6 x10E3/uL (ref 0.1–0.9)
Monocytes: 9 %
Neutrophils Absolute: 4.1 x10E3/uL (ref 1.4–7.0)
Neutrophils: 64 %
Platelets: 294 x10E3/uL (ref 150–450)
RBC: 5.73 x10E6/uL (ref 4.14–5.80)
RDW: 13.2 % (ref 11.6–15.4)
WBC: 6.4 x10E3/uL (ref 3.4–10.8)

## 2024-06-19 LAB — LIPID PANEL
Chol/HDL Ratio: 4.3 ratio (ref 0.0–5.0)
Cholesterol, Total: 145 mg/dL (ref 100–199)
HDL: 34 mg/dL — ABNORMAL LOW
LDL Chol Calc (NIH): 91 mg/dL (ref 0–99)
Triglycerides: 110 mg/dL (ref 0–149)
VLDL Cholesterol Cal: 20 mg/dL (ref 5–40)

## 2024-06-19 LAB — TSH: TSH: 0.09 u[IU]/mL — ABNORMAL LOW (ref 0.450–4.500)

## 2024-06-19 MED ORDER — LEVOTHYROXINE SODIUM 137 MCG PO TABS: ORAL_TABLET | ORAL | 3 refills | Status: AC

## 2024-06-19 NOTE — Progress Notes (Signed)
 "  BP 129/71   Pulse (!) 54   Ht 6' 3 (1.905 m)   Wt (!) 317 lb (143.8 kg)   SpO2 95%   BMI 39.62 kg/m    Subjective:   Patient ID: Jay Lang, male    DOB: 07-13-1975, 48 y.o.   MRN: [id number]  HPI: Jay Lang is a 48 y.o. male presenting on 06/19/2024 for Medical Management of Chronic Issues (CPE), Hypertension, and Hypothyroidism   Discussed the use of AI scribe software for clinical note transcription with the patient, who gave verbal consent to proceed.  History of Present Illness   Jay Lang is a 48 year old male who presents for an annual physical exam.  Thyroid  disease - Thyroid  condition is stable with no negative symptoms. - Continues on same medication and dosage. - Biannual blood tests in [place] show no need for changes this year.  Obesity - Current weight is 317 pounds, improved from previous measurements in the upper 300s. - Recent weight of 307 pounds a few weeks ago without clothes.  Hypertension - Blood pressure measured at 129/71 mmHg. - Currently taking amlodipine .  Dyslipidemia - Taking atorvastatin . - Awaiting cholesterol level checks during this visit. - Fasted prior to blood work.  Gastroesophageal reflux disease - Takes Nexium  regularly. - Missing doses for two or three days results in noticeable symptoms. - Describes Nexium  as a 'miracle' medication for his condition.  Pruritus and suspected eczema - Severe itching on legs and back occurs only in wintertime. - Suspects eczema based on wife's suggestion. - Similar symptoms on hands, particularly around one finger, which cracks frequently.  Preventive care and immunizations - Received flu shot recently. - Cautious about flu exposure due to upcoming holiday gatherings and planned cruise. - Colonoscopy was due last year and has not been completed. - Uncertain about referral process for rescheduling colonoscopy.          Relevant past medical, surgical, family and social history  reviewed and updated as indicated. Interim medical history since our last visit reviewed. Allergies and medications reviewed and updated.  Review of Systems  Constitutional:  Negative for chills and fever.  HENT:  Negative for ear pain and tinnitus.   Eyes:  Negative for pain.  Respiratory:  Negative for cough, shortness of breath and wheezing.   Cardiovascular:  Negative for chest pain, palpitations and leg swelling.  Gastrointestinal:  Negative for abdominal pain, blood in stool, constipation and diarrhea.  Genitourinary:  Negative for dysuria and hematuria.  Musculoskeletal:  Negative for back pain, gait problem and myalgias.  Skin:  Positive for rash.  Neurological:  Negative for dizziness, weakness and headaches.  Psychiatric/Behavioral:  Negative for suicidal ideas.   All other systems reviewed and are negative.   Per HPI unless specifically indicated above   Allergies as of 06/19/2024       Reactions   Penicillins    Rash        Medication List        Accurate as of June 19, 2024  9:48 AM. If you have any questions, ask your nurse or doctor.          amLODipine  5 MG tablet Commonly known as: NORVASC  Take 1 tablet (5 mg total) by mouth daily.   atorvastatin  20 MG tablet Commonly known as: LIPITOR Take 1 tablet (20 mg total) by mouth daily.   esomeprazole  40 MG capsule Commonly known as: NEXIUM  Take 1 capsule (40 mg total) by mouth daily.  levothyroxine  137 MCG tablet Commonly known as: Levoxyl  TAKE 2 TABLETS (274 MCG TOTAL) BY MOUTH DAILY. EXCEPT ON SUNDAY TAKE 3 TABLETS         Objective:   BP 129/71   Pulse (!) 54   Ht 6' 3 (1.905 m)   Wt (!) 317 lb (143.8 kg)   SpO2 95%   BMI 39.62 kg/m   Wt Readings from Last 3 Encounters:  06/19/24 (!) 317 lb (143.8 kg)  12/16/23 (!) 321 lb 8 oz (145.8 kg)  06/14/23 (!) 311 lb (141.1 kg)    Physical Exam Physical Exam   VITALS: BP- 129/71 MEASUREMENTS: Weight- 317. HEENT: External  auditory canals clear bilaterally. Pharynx normal. CHEST: Lungs clear to auscultation bilaterally. CARDIOVASCULAR: Regular rate and rhythm, no murmurs. ABDOMEN: Abdomen non-tender. EXTREMITIES: No edema, good peripheral pulses.      Fine papular rash excoriations on both lower extremities, consistent with atopic dermatitis   Assessment & Plan:   Problem List Items Addressed This Visit       Cardiovascular and Mediastinum   Essential hypertension   Relevant Orders   CMP14+EGFR   CBC With Diff/Platelet   Lipid panel   TSH     Endocrine   Hypothyroid   Relevant Medications   levothyroxine  (LEVOXYL ) 137 MCG tablet   Other Relevant Orders   CMP14+EGFR   CBC With Diff/Platelet   Lipid panel   TSH     Other   Dyslipidemia   Relevant Orders   CMP14+EGFR   CBC With Diff/Platelet   Lipid panel   TSH   Morbid obesity (HCC)   Other Visit Diagnoses       Physical exam    -  Primary   Relevant Orders   CMP14+EGFR   CBC With Diff/Platelet   Lipid panel   TSH     Atopic dermatitis, unspecified type         Colon cancer screening       Relevant Orders   Ambulatory referral to Gastroenterology          Acquired hypothyroidism Well-managed with current medication regimen. - Continue current thyroid  medication regimen. - Continue regular follow-up with endocrinologist in [place].  Morbid obesity Weight is 317 lbs, improved from previous measurements. Weight management is ongoing.  Essential hypertension Blood pressure is well-controlled at 129/71 mmHg with current medication regimen. - Continue current antihypertensive medication regimen.  Dyslipidemia Managed with atorvastatin . Cholesterol levels to be checked today. - Checked cholesterol levels today. - Continue atorvastatin .  Eczema Intermittent flare-ups, primarily in winter, affecting legs and hands. Likely related to dry air and immune system factors. - Use Eucerin or CeraVe cream for skin  hydration. - Apply hydrocortisone cream as needed for flare-ups. - Avoid scratching to prevent worsening of eczema.  General Health Maintenance Received flu shot recently. Discussed potential side effects and importance of hand hygiene, especially during travel. - Encouraged hand hygiene, especially during travel. - Encouraged use of hand sanitizer when necessary.          Follow up plan: Return in about 6 months (around 12/18/2024), or if symptoms worsen or fail to improve, for Hypothyroidism and hyperlipidemia.  Counseling provided for all of the vaccine components Orders Placed This Encounter  Procedures   CMP14+EGFR   CBC With Diff/Platelet   Lipid panel   TSH   Ambulatory referral to Gastroenterology    Fonda Levins, MD [facility] 06/19/2024, 9:48 AM     "

## 2024-06-29 ENCOUNTER — Ambulatory Visit: Payer: Self-pay | Admitting: Family Medicine

## 2024-12-18 ENCOUNTER — Ambulatory Visit: Admitting: Family Medicine
